# Patient Record
Sex: Female | Born: 1970 | Race: White | Hispanic: No | Marital: Married | Smoking: Never smoker
Health system: Southern US, Community
[De-identification: ages and names within clinical notes are randomized; demographics above are authoritative.]

## PROBLEM LIST (undated history)

## (undated) DIAGNOSIS — F419 Anxiety disorder, unspecified: Secondary | ICD-10-CM

## (undated) DIAGNOSIS — G510 Bell's palsy: Secondary | ICD-10-CM

## (undated) DIAGNOSIS — F32A Depression, unspecified: Secondary | ICD-10-CM

## (undated) DIAGNOSIS — I10 Essential (primary) hypertension: Secondary | ICD-10-CM

## (undated) DIAGNOSIS — F329 Major depressive disorder, single episode, unspecified: Secondary | ICD-10-CM

## (undated) HISTORY — PX: APPENDECTOMY: SHX54

---

## 2018-08-17 ENCOUNTER — Emergency Department (HOSPITAL_COMMUNITY)
Admission: EM | Admit: 2018-08-17 | Discharge: 2018-08-17 | Disposition: A | Discharge status: Home / Self Care | Payer: Self-pay | Attending: Emergency Medicine | Admitting: Emergency Medicine

## 2018-08-17 ENCOUNTER — Encounter (HOSPITAL_COMMUNITY): Payer: Self-pay

## 2018-08-17 DIAGNOSIS — R519 Headache, unspecified: Secondary | ICD-10-CM

## 2018-08-17 DIAGNOSIS — R51 Headache: Secondary | ICD-10-CM | POA: Insufficient documentation

## 2018-08-17 DIAGNOSIS — I10 Essential (primary) hypertension: Secondary | ICD-10-CM | POA: Insufficient documentation

## 2018-08-17 HISTORY — DX: Bell's palsy: G51.0

## 2018-08-17 LAB — I-STAT BETA HCG BLOOD, ED (MC, WL, AP ONLY): I-stat hCG, quantitative: <5 m[IU]/mL (ref <5)

## 2018-08-17 MED ORDER — METOCLOPRAMIDE HCL 5 MG/ML IJ SOLN
10.0000 mg | Freq: Once | INTRAMUSCULAR | Status: AC
  Administered 2018-08-17: 10 mg via INTRAVENOUS
  Filled 2018-08-17: qty 2

## 2018-08-17 MED ORDER — DIPHENHYDRAMINE HCL 50 MG/ML IJ SOLN
12.5000 mg | Freq: Once | INTRAMUSCULAR | Status: AC
  Administered 2018-08-17: 12.5 mg via INTRAVENOUS
  Filled 2018-08-17: qty 1

## 2018-08-17 MED ORDER — LISINOPRIL-HYDROCHLOROTHIAZIDE 20-12.5 MG PO TABS: 1.0000 | ORAL_TABLET | Freq: Every day | ORAL | 0 refills | Status: AC

## 2018-08-17 MED ORDER — LISINOPRIL 20 MG PO TABS
20.0000 mg | ORAL_TABLET | Freq: Once | ORAL | Status: AC
  Administered 2018-08-17: 20 mg via ORAL
  Filled 2018-08-17: qty 1

## 2018-08-17 MED ORDER — HYDROCHLOROTHIAZIDE 12.5 MG PO CAPS
12.5000 mg | ORAL_CAPSULE | Freq: Every day | ORAL | Status: DC
  Administered 2018-08-17: 12.5 mg via ORAL
  Filled 2018-08-17: qty 1

## 2018-08-17 NOTE — ED Provider Notes (Signed)
[facility] Provider Note   CSN: [id number] Arrival date & time: 08/17/18  1159   History   Chief Complaint Chief Complaint  Patient presents with  . Headache  . Hypertension    HPI Valerie Watson is a 48 y.o. female with a PMH of Bell's palsy presenting with HTN and a constant diffuse headache onset 2 days ago. Patient states she has tried tylenol without relief. Patient describes headache as throbbing, sharp pain. Patient states she ran out of her blood pressure medications 2 days ago and has not set up a new primary care provider since she moved to this area a few months ago. Patient states she takes lisinopril and HCTZ daily. Patient states loud noises make headache worse, and nothing makes headache better. Patient reports associated blurry vision and dizziness. Patient reports chronic facial asymmetry, but denies any recent changes. Patient denies nausea, vomiting, or abdominal pain. Pt denies chest pain, shortness of breath, weakness, numbness, or paresthesias. Patient denies fever, neck pain, or photophobia.   HPI  Past Medical History:  Diagnosis Date  . Bell's palsy     There are no active problems to display for this patient.   History reviewed. No pertinent surgical history.   OB History   No obstetric history on file.      Home Medications    Prior to Admission medications   Medication Sig Start Date End Date Taking? Authorizing Provider  lisinopril-hydrochlorothiazide (ZESTORETIC) 20-12.5 MG tablet Take 1 tablet by mouth daily for 20 days. 08/17/18 09/06/18  Leretha DykesHernandez, Kaitlynne Wenz P, PA-C    Family History No family history on file.  Social History Social History   Tobacco Use  . Smoking status: Not on file  Substance Use Topics  . Alcohol use: Not on file  . Drug use: Not on file     Allergies   Eggs or egg-derived products and Penicillins   Review of Systems Review of Systems  Constitutional: Negative for  activity change, appetite change, chills, diaphoresis, fatigue, fever and unexpected weight change.  HENT: Negative for congestion, ear pain, sinus pressure and sore throat.   Eyes: Positive for visual disturbance. Negative for photophobia.  Respiratory: Negative for shortness of breath.   Cardiovascular: Negative for chest pain.  Gastrointestinal: Negative for abdominal pain, nausea and vomiting.  Musculoskeletal: Negative for gait problem, myalgias, neck pain and neck stiffness.  Skin: Negative for color change and rash.  Allergic/Immunologic: Negative for immunocompromised state.  Neurological: Positive for dizziness, facial asymmetry (Pt reports this is chronic for the last 20 years.) and headaches. Negative for seizures, syncope, speech difficulty, weakness and numbness.  Hematological: Does not bruise/bleed easily.  Psychiatric/Behavioral: Negative for sleep disturbance. The patient is not nervous/anxious.     Physical Exam Updated Vital Signs BP (!) 156/99 (BP Location: Right Arm)   Pulse 81   Temp 98.2 F (36.8 C) (Oral)   Resp 16   SpO2 100%   Physical Exam Vitals signs and nursing note reviewed.  Constitutional:      General: She is not in acute distress.    Appearance: She is well-developed. She is not diaphoretic.  HENT:     Head: Normocephalic and atraumatic.  Eyes:     General: No scleral icterus.    Extraocular Movements: Extraocular movements intact.     Pupils: Pupils are equal, round, and reactive to light. Pupils are equal.  Cardiovascular:     Rate and Rhythm: Normal rate and regular rhythm.  Heart sounds: Normal heart sounds. No murmur. No friction rub. No gallop.   Pulmonary:     Effort: Pulmonary effort is normal. No respiratory distress.     Breath sounds: Normal breath sounds. No wheezing or rales.  Musculoskeletal: Normal range of motion.  Skin:    Findings: No erythema or rash.  Neurological:     Mental Status: She is alert and oriented to  person, place, and time.   Mental Status:  Alert, oriented, thought content appropriate, able to give a coherent history. Speech fluent without evidence of aphasia. Able to follow 2 step commands without difficulty.  Cranial Nerves:  II:  Peripheral visual fields grossly normal, pupils equal, round, reactive to light III,IV, VI: ptosis not present, extra-ocular motions intact bilaterally  V,VII: smile symmetric, facial light touch sensation equal VIII: hearing grossly normal to voice  X: uvula elevates symmetrically  XI: bilateral shoulder shrug symmetric and strong XII: midline tongue extension without fassiculations Motor:  Normal tone. 5/5 in upper and lower extremities bilaterally including strong and equal grip strength and dorsiflexion/plantar flexion Sensory: light touch normal in all extremities.  Deep Tendon Reflexes: 2+ and symmetric in the biceps and patella Cerebellar: normal finger-to-nose with bilateral upper extremities Gait: normal gait and balance.  Negative pronator drift. Negative Romberg sign. CV: distal pulses palpable throughout   ED Treatments / Results  Labs (all labs ordered are listed, but only abnormal results are displayed) Labs Reviewed  I-STAT BETA HCG BLOOD, ED ([facility], [facility], [facility] ONLY)    EKG None  Radiology No results found.  Procedures Procedures (including critical care time)  Medications Ordered in ED Medications  hydrochlorothiazide (MICROZIDE) capsule 12.5 mg (12.5 mg Oral Given 08/17/18 1531)  metoCLOPramide (REGLAN) injection 10 mg (10 mg Intravenous Given 08/17/18 1549)  diphenhydrAMINE (BENADRYL) injection 12.5 mg (12.5 mg Intravenous Given 08/17/18 1549)  lisinopril (PRINIVIL,ZESTRIL) tablet 20 mg (20 mg Oral Given 08/17/18 1623)     Initial Impression / Assessment and Plan / ED Course  I have reviewed the triage vital signs and the nursing notes.  Pertinent labs & imaging results that were available during my care of the patient were  reviewed by me and considered in my medical decision making (see chart for details).  Clinical Course as of Aug 17 1644  Thu Aug 17, 2018  1640 Pt reports headache has significantly improved while in the ER. Patient is requesting to be discharged.   [AH]    Clinical Course User Index [AH] Carlyle Basques P, PA-C   Pt HA treated and improved while in ED.  Patient was provided her usual blood pressure medication. Presentation is non concerning for [facility], ICH, Meningitis, or temporal arteritis. Pt is afebrile with no new focal neuro deficits or nuchal rigidity. Blood pressure has improved while in the ER. Pt is to follow up with PCP to manage HTN. Refilled BP medications, but strictly advised patient to follow up with PCP regarding BP control. Discussed returning to ER for new or worsening symptoms. Pt verbalizes understanding and is agreeable with plan to dc.   Final Clinical Impressions(s) / ED Diagnoses   Final diagnoses:  Essential hypertension  Bad headache    ED Discharge Orders         Ordered    lisinopril-hydrochlorothiazide (ZESTORETIC) 20-12.5 MG tablet  Daily     08/17/18 1646           Leretha Dykes, [place] 08/17/18 1647    Little, Ambrose Finland, MD 08/18/18 [phone]

## 2018-08-17 NOTE — ED Triage Notes (Signed)
Pt presents for evaluation of headache x 2-3 days. States normally takes lisinopril and HCTZ for BP and ran out of medications.

## 2018-08-17 NOTE — Discharge Instructions (Signed)
You have been seen today for headache and high blood pressure. Please read and follow all provided instructions.   1. Medications: Lisinopril, HCTZ, usual home medications 2. Treatment: rest, drink plenty of fluids 3. Follow Up: Please follow up with your primary doctor in 2 days for discussion of your diagnoses and further evaluation after today's visit; if you do not have a primary care doctor use the resource guide provided to find one; Please return to the ER for any new or worsening symptoms. Please obtain all of your results from medical records or have your doctors office obtain the results - share them with your doctor - you should be seen at your doctors office. Call today to arrange your follow up.   Take medications as prescribed. Please review all of the medicines and only take them if you do not have an allergy to them. Return to the emergency room for worsening condition or new concerning symptoms. Follow up with your regular doctor. If you don't have a regular doctor use one of the numbers below to establish a primary care doctor.  Please be aware that if you are taking birth control pills, taking other prescriptions, ESPECIALLY ANTIBIOTICS may make the birth control ineffective - if this is the case, either do not engage in sexual activity or use alternative methods of birth control such as condoms until you have finished the medicine and your family doctor says it is OK to restart them. If you are on a blood thinner such as COUMADIN, be aware that any other medicine that you take may cause the coumadin to either work too much, or not enough - you should have your coumadin level rechecked in next 7 days if this is the case.  ?  It is also a possibility that you have an allergic reaction to any of the medicines that you have been prescribed - Everybody reacts differently to medications and while MOST people have no trouble with most medicines, you may have a reaction such as nausea,  vomiting, rash, swelling, shortness of breath. If this is the case, please stop taking the medicine immediately and contact your physician.  ?  You should return to the ER if you develop severe or worsening symptoms.   Emergency Department Resource Guide 1) Find a Doctor and Pay Out of Pocket Although you won't have to find out who is covered by your insurance plan, it is a good idea to ask around and get recommendations. You will then need to call the office and see if the doctor you have chosen will accept you as a new patient and what types of options they offer for patients who are self-pay. Some doctors offer discounts or will set up payment plans for their patients who do not have insurance, but you will need to ask so you aren't surprised when you get to your appointment.  2) Contact Your Local Health Department Not all health departments have doctors that can see patients for sick visits, but many do, so it is worth a call to see if yours does. If you don't know where your local health department is, you can check in your phone book. The CDC also has a tool to help you locate your state's health department, and many state websites also have listings of all of their local health departments.  3) Find a Walk-in Clinic If your illness is not likely to be very severe or complicated, you may want to try a walk in clinic. These  are popping up all over the country in pharmacies, drugstores, and shopping centers. They're usually staffed by nurse practitioners or physician assistants that have been trained to treat common illnesses and complaints. They're usually fairly quick and inexpensive. However, if you have serious medical issues or chronic medical problems, these are probably not your best option.  No Primary Care Doctor: Call Health Connect at  [phone] - they can help you locate a primary care doctor that  accepts your insurance, provides certain services, etc. Physician Referral [phone]  Emergency Department Resource Guide 1) Find a Doctor and Pay Out of Pocket Although you won't have to find out who is covered by your insurance plan, it is a good idea to ask around and get recommendations. You will then need to call the office and see if the doctor you have chosen will accept you as a new patient and what types of options they offer for patients who are self-pay. Some doctors offer discounts or will set up payment plans for their patients who do not have insurance, but you will need to ask so you aren't surprised when you get to your appointment.  2) Contact Your Local Health Department Not all health departments have doctors that can see patients for sick visits, but many do, so it is worth a call to see if yours does. If you don't know where your local health department is, you can check in your phone book. The CDC also has a tool to help you locate your state's health department, and many state websites also have listings of all of their local health departments.  3) Find a [facility] If your illness is not likely to be very severe or complicated, you may want to try a walk in clinic. These are popping up all over the country in pharmacies, drugstores, and shopping centers. They're usually staffed by nurse practitioners or physician assistants that have been trained to treat common illnesses and complaints. They're usually fairly quick and inexpensive. However, if you have serious medical issues or chronic medical problems, these are probably not your best option.  No Primary Care Doctor: Call Health Connect at  [phone] - they can help you locate a primary care doctor that  accepts your insurance, provides certain services, etc. Physician Referral Service- [phone]  Chronic Pain Problems: Organization         Address  Phone   Notes  [facility]  [phone] Patients need to be referred by their primary care doctor.    Medication Assistance: Organization         Address  Phone   Notes  [facility] Medication [facility] [address] [place], [place] [address] [phone] --Must be a resident of [facility] -- Must have NO insurance coverage whatsoever (no Medicaid/ Medicare, etc.) -- The pt. MUST have a primary care doctor that directs their care regularly and follows them in the community   [facility]  [phone]   [employer]  [phone]    Agencies that provide inexpensive medical care: Organization         Address  Phone   Notes  [place]  [phone]   [facility]    [phone]   [facility] [place] [place],  [phone]   [place] [address] [place] [phone]   [facility]anned Parenthood    (  [phone]   [facility]    [phone]   [facility]  [address], [place] Phone:  [phone], Fax:  [phone] Hours of Operation:  9 am - 6 pm, M-F.  Also accepts Medicaid/Medicare and self-pay.  [facility] for [place] [address], [address], [place] Phone: [phone], Fax: [phone]. Hours of Operation:  8:30 am - 5:30 pm, M-F.  Also accepts Medicaid and self-pay.  [facility] [place] [address], [place] Phone: [phone]   [place], [place], [place] [phone], Ext. [phone] Mondays & Thursdays: 7-9 AM.  First 15 patients are seen on a first come, first serve basis.    [place] Providers:  Organization         Address  Phone   Notes  [facility] [address], [place] [phone] Also accepts self-pay patients.  [facility] [address], [place]  [phone]   [place], [address], [place] [phone]   [facility] [address] [place] [phone]   Lucianne Lei [address], [place]   [phone] Only accepts [place] [facility] [place] patients after they have their name applied to their card.   Self-Pay (no insurance) in [facility]:  Organization         Address  Phone   Notes  Sickle Cell Patients, [facility] [place] [phone]   [facility] [place] [phone]   [facility] [place]  [place], [place], [place] [phone]   [facility]/Dr. Osei-Bonsu  [address], [place] or [address], [place] [phone] Phone number for both [place] and [place] locations is the same.  [facility] and [facility] [address], [place] [phone]   [facility] [address] [place] or [address] [phone]   [facility] [address] [place] [phone], phone; [phone], fax Sees patients 1st and 3rd Saturday of every month.  Must not qualify for public or private insurance (i.e. Medicaid, Medicare, [place] Health Choice, Veterans' Benefits)  Household income should be no more than 200% of the poverty level The clinic cannot treat you if you are pregnant or think you are pregnant  Sexually transmitted diseases are not treated at the clinic.

## 2018-08-22 ENCOUNTER — Emergency Department: Payer: Self-pay

## 2018-08-22 ENCOUNTER — Encounter: Payer: Self-pay | Admitting: Emergency Medicine

## 2018-08-22 ENCOUNTER — Emergency Department: Payer: Self-pay | Admitting: Anesthesiology

## 2018-08-22 ENCOUNTER — Other Ambulatory Visit: Payer: Self-pay

## 2018-08-22 ENCOUNTER — Encounter
Admission: EM | Disposition: A | Discharge status: Home / Self Care | Payer: Self-pay | Source: Home / Self Care | Attending: Emergency Medicine

## 2018-08-22 ENCOUNTER — Observation Stay
Admission: EM | Admit: 2018-08-22 | Discharge: 2018-08-23 | Disposition: A | Discharge status: Home / Self Care | Payer: Self-pay | Attending: Surgery | Admitting: Surgery

## 2018-08-22 DIAGNOSIS — S82301A Unspecified fracture of lower end of right tibia, initial encounter for closed fracture: Secondary | ICD-10-CM

## 2018-08-22 DIAGNOSIS — I10 Essential (primary) hypertension: Secondary | ICD-10-CM | POA: Insufficient documentation

## 2018-08-22 DIAGNOSIS — S82891A Other fracture of right lower leg, initial encounter for closed fracture: Secondary | ICD-10-CM | POA: Diagnosis present

## 2018-08-22 DIAGNOSIS — S82851A Displaced trimalleolar fracture of right lower leg, initial encounter for closed fracture: Principal | ICD-10-CM | POA: Insufficient documentation

## 2018-08-22 DIAGNOSIS — IMO0001 Reserved for inherently not codable concepts without codable children: Secondary | ICD-10-CM

## 2018-08-22 DIAGNOSIS — Z419 Encounter for procedure for purposes other than remedying health state, unspecified: Secondary | ICD-10-CM

## 2018-08-22 DIAGNOSIS — F419 Anxiety disorder, unspecified: Secondary | ICD-10-CM | POA: Insufficient documentation

## 2018-08-22 DIAGNOSIS — S82831A Other fracture of upper and lower end of right fibula, initial encounter for closed fracture: Secondary | ICD-10-CM

## 2018-08-22 DIAGNOSIS — F329 Major depressive disorder, single episode, unspecified: Secondary | ICD-10-CM | POA: Insufficient documentation

## 2018-08-22 DIAGNOSIS — J449 Chronic obstructive pulmonary disease, unspecified: Secondary | ICD-10-CM | POA: Insufficient documentation

## 2018-08-22 DIAGNOSIS — Z23 Encounter for immunization: Secondary | ICD-10-CM | POA: Insufficient documentation

## 2018-08-22 DIAGNOSIS — Z9889 Other specified postprocedural states: Secondary | ICD-10-CM

## 2018-08-22 DIAGNOSIS — S9304XA Dislocation of right ankle joint, initial encounter: Secondary | ICD-10-CM

## 2018-08-22 DIAGNOSIS — W010XXA Fall on same level from slipping, tripping and stumbling without subsequent striking against object, initial encounter: Secondary | ICD-10-CM | POA: Insufficient documentation

## 2018-08-22 HISTORY — DX: Depression, unspecified: F32.A

## 2018-08-22 HISTORY — PX: ORIF ANKLE FRACTURE: SHX5408

## 2018-08-22 HISTORY — DX: Essential (primary) hypertension: I10

## 2018-08-22 HISTORY — DX: Major depressive disorder, single episode, unspecified: F32.9

## 2018-08-22 HISTORY — DX: Anxiety disorder, unspecified: F41.9

## 2018-08-22 LAB — CBC WITH DIFFERENTIAL/PLATELET
Abs Immature Granulocytes: 0.03 K/uL (ref 0.00–0.07)
Basophils Absolute: 0 K/uL (ref 0.0–0.1)
Basophils Relative: 1 %
Eosinophils Absolute: 0.1 K/uL (ref 0.0–0.5)
Eosinophils Relative: 1 %
HCT: 40.1 % (ref 36.0–46.0)
Hemoglobin: 13.3 g/dL (ref 12.0–15.0)
Immature Granulocytes: 0 %
Lymphocytes Relative: 16 %
Lymphs Abs: 1.1 K/uL (ref 0.7–4.0)
MCH: 31.9 pg (ref 26.0–34.0)
MCHC: 33.2 g/dL (ref 30.0–36.0)
MCV: 96.2 fL (ref 80.0–100.0)
Monocytes Absolute: 0.5 K/uL (ref 0.1–1.0)
Monocytes Relative: 7 %
Neutro Abs: 5.2 K/uL (ref 1.7–7.7)
Neutrophils Relative %: 75 %
Platelets: 304 K/uL (ref 150–400)
RBC: 4.17 MIL/uL (ref 3.87–5.11)
RDW: 12.9 % (ref 11.5–15.5)
WBC: 7 K/uL (ref 4.0–10.5)
nRBC: 0 % (ref 0.0–0.2)

## 2018-08-22 LAB — COMPREHENSIVE METABOLIC PANEL WITH GFR
ALT: 37 U/L (ref 0–44)
AST: 30 U/L (ref 15–41)
Albumin: 4.7 g/dL (ref 3.5–5.0)
Alkaline Phosphatase: 73 U/L (ref 38–126)
Anion gap: 10 (ref 5–15)
BUN: 22 mg/dL — ABNORMAL HIGH (ref 6–20)
CO2: 24 mmol/L (ref 22–32)
Calcium: 9.1 mg/dL (ref 8.9–10.3)
Chloride: 107 mmol/L (ref 98–111)
Creatinine, Ser: 0.78 mg/dL (ref 0.44–1.00)
GFR calc Af Amer: >60 mL/min (ref >60)
GFR calc non Af Amer: >60 mL/min (ref >60)
Glucose, Bld: 118 mg/dL — ABNORMAL HIGH (ref 70–99)
Potassium: 3.3 mmol/L — ABNORMAL LOW (ref 3.5–5.1)
Sodium: 141 mmol/L (ref 135–145)
Total Bilirubin: 0.6 mg/dL (ref 0.3–1.2)
Total Protein: 8 g/dL (ref 6.5–8.1)

## 2018-08-22 SURGERY — OPEN REDUCTION INTERNAL FIXATION (ORIF) ANKLE FRACTURE
Anesthesia: General | Site: Ankle | Laterality: Right

## 2018-08-22 MED ORDER — ACETAMINOPHEN 325 MG PO TABS: 325.0000 mg | ORAL_TABLET | Freq: Four times a day (QID) | ORAL | Status: DC | PRN

## 2018-08-22 MED ORDER — CLINDAMYCIN PHOSPHATE 600 MG/50ML IV SOLN
600.0000 mg | Freq: Four times a day (QID) | INTRAVENOUS | Status: AC
  Administered 2018-08-23 (×3): 600 mg via INTRAVENOUS
  Filled 2018-08-22 (×3): qty 50

## 2018-08-22 MED ORDER — KETOROLAC TROMETHAMINE 30 MG/ML IJ SOLN
INTRAMUSCULAR | Status: AC
  Filled 2018-08-22: qty 1

## 2018-08-22 MED ORDER — LIDOCAINE HCL (PF) 2 % IJ SOLN
INTRAMUSCULAR | Status: AC
  Filled 2018-08-22: qty 10

## 2018-08-22 MED ORDER — LORAZEPAM 2 MG/ML IJ SOLN
INTRAMUSCULAR | Status: AC
  Filled 2018-08-22: qty 1

## 2018-08-22 MED ORDER — FENTANYL CITRATE (PF) 100 MCG/2ML IJ SOLN
INTRAMUSCULAR | Status: DC | PRN
  Administered 2018-08-22 (×4): 50 ug via INTRAVENOUS

## 2018-08-22 MED ORDER — HYDROMORPHONE HCL 1 MG/ML IJ SOLN
0.5000 mg | Freq: Once | INTRAMUSCULAR | Status: DC
  Filled 2018-08-22: qty 1

## 2018-08-22 MED ORDER — HYDROMORPHONE HCL 1 MG/ML IJ SOLN
0.2500 mg | INTRAMUSCULAR | Status: DC | PRN
  Administered 2018-08-22 (×4): 0.25 mg via INTRAVENOUS

## 2018-08-22 MED ORDER — LIDOCAINE HCL (CARDIAC) PF 100 MG/5ML IV SOSY
PREFILLED_SYRINGE | INTRAVENOUS | Status: DC | PRN
  Administered 2018-08-22: 80 mg via INTRAVENOUS

## 2018-08-22 MED ORDER — LORAZEPAM 1 MG PO TABS: 1.5000 mg | ORAL_TABLET | Freq: Every day | ORAL | Status: DC

## 2018-08-22 MED ORDER — KETAMINE HCL 10 MG/ML IJ SOLN: 60.0000 mg | Freq: Once | INTRAMUSCULAR | Status: DC

## 2018-08-22 MED ORDER — HYDROMORPHONE HCL 1 MG/ML IJ SOLN
INTRAMUSCULAR | Status: AC
  Administered 2018-08-22: 0.25 mg via INTRAVENOUS
  Filled 2018-08-22: qty 0.5

## 2018-08-22 MED ORDER — INFLUENZA VAC SPLIT QUAD 0.5 ML IM SUSY
0.5000 mL | PREFILLED_SYRINGE | INTRAMUSCULAR | Status: AC
  Administered 2018-08-23: 0.5 mL via INTRAMUSCULAR
  Filled 2018-08-22: qty 0.5

## 2018-08-22 MED ORDER — KETOROLAC TROMETHAMINE 30 MG/ML IJ SOLN
30.0000 mg | Freq: Once | INTRAMUSCULAR | Status: AC
  Administered 2018-08-22: 30 mg via INTRAVENOUS

## 2018-08-22 MED ORDER — KETAMINE HCL 10 MG/ML IJ SOLN
INTRAMUSCULAR | Status: AC | PRN
  Administered 2018-08-22: 60 mg via INTRAVENOUS
  Administered 2018-08-22: 20 mg via INTRAVENOUS

## 2018-08-22 MED ORDER — MIRTAZAPINE 15 MG PO TABS: 15.0000 mg | ORAL_TABLET | Freq: Every day | ORAL | Status: DC

## 2018-08-22 MED ORDER — ACETAMINOPHEN 500 MG PO TABS
1000.0000 mg | ORAL_TABLET | Freq: Four times a day (QID) | ORAL | Status: DC
  Administered 2018-08-23: 1000 mg via ORAL
  Filled 2018-08-22 (×2): qty 2

## 2018-08-22 MED ORDER — LACTATED RINGERS IV SOLN
INTRAVENOUS | Status: DC | PRN
  Administered 2018-08-22 (×2): via INTRAVENOUS

## 2018-08-22 MED ORDER — FAMOTIDINE 20 MG PO TABS
ORAL_TABLET | ORAL | Status: AC
  Administered 2018-08-22: 20 mg via ORAL
  Filled 2018-08-22: qty 1

## 2018-08-22 MED ORDER — KETAMINE HCL 10 MG/ML IJ SOLN
INTRAMUSCULAR | Status: AC
  Filled 2018-08-22: qty 1

## 2018-08-22 MED ORDER — DOCUSATE SODIUM 100 MG PO CAPS
100.0000 mg | ORAL_CAPSULE | Freq: Two times a day (BID) | ORAL | Status: DC
  Administered 2018-08-22 – 2018-08-23 (×2): 100 mg via ORAL
  Filled 2018-08-22 (×2): qty 1

## 2018-08-22 MED ORDER — MIDAZOLAM HCL 2 MG/2ML IJ SOLN
INTRAMUSCULAR | Status: DC | PRN
  Administered 2018-08-22: 2 mg via INTRAVENOUS

## 2018-08-22 MED ORDER — ONDANSETRON HCL 4 MG/2ML IJ SOLN
INTRAMUSCULAR | Status: AC
  Filled 2018-08-22: qty 2

## 2018-08-22 MED ORDER — SODIUM CHLORIDE 0.9 % IV SOLN
INTRAVENOUS | Status: DC
  Administered 2018-08-22: 23:00:00 via INTRAVENOUS

## 2018-08-22 MED ORDER — MIDAZOLAM HCL 2 MG/2ML IJ SOLN
INTRAMUSCULAR | Status: AC
  Filled 2018-08-22: qty 2

## 2018-08-22 MED ORDER — DIPHENHYDRAMINE HCL 12.5 MG/5ML PO ELIX: 12.5000 mg | ORAL_SOLUTION | ORAL | Status: DC | PRN

## 2018-08-22 MED ORDER — FENTANYL CITRATE (PF) 100 MCG/2ML IJ SOLN
INTRAMUSCULAR | Status: AC | PRN
  Administered 2018-08-22: 100 ug via INTRAVENOUS

## 2018-08-22 MED ORDER — FAMOTIDINE 20 MG PO TABS
20.0000 mg | ORAL_TABLET | Freq: Once | ORAL | Status: AC
  Administered 2018-08-22: 20 mg via ORAL

## 2018-08-22 MED ORDER — FLEET ENEMA 7-19 GM/118ML RE ENEM: 1.0000 | ENEMA | Freq: Once | RECTAL | Status: DC | PRN

## 2018-08-22 MED ORDER — LISINOPRIL 20 MG PO TABS
20.0000 mg | ORAL_TABLET | Freq: Every day | ORAL | Status: DC
  Filled 2018-08-22: qty 1

## 2018-08-22 MED ORDER — CLINDAMYCIN PHOSPHATE 600 MG/50ML IV SOLN
600.0000 mg | Freq: Once | INTRAVENOUS | Status: AC
  Administered 2018-08-22: 600 mg via INTRAVENOUS

## 2018-08-22 MED ORDER — ENOXAPARIN SODIUM 40 MG/0.4ML ~~LOC~~ SOLN
40.0000 mg | SUBCUTANEOUS | Status: DC
  Administered 2018-08-23: 40 mg via SUBCUTANEOUS
  Filled 2018-08-22: qty 0.4

## 2018-08-22 MED ORDER — KETOROLAC TROMETHAMINE 15 MG/ML IJ SOLN
15.0000 mg | Freq: Four times a day (QID) | INTRAMUSCULAR | Status: DC
  Administered 2018-08-23: 15 mg via INTRAVENOUS
  Filled 2018-08-22 (×2): qty 1

## 2018-08-22 MED ORDER — METOCLOPRAMIDE HCL 10 MG PO TABS: 5.0000 mg | ORAL_TABLET | Freq: Three times a day (TID) | ORAL | Status: DC | PRN

## 2018-08-22 MED ORDER — ACETAMINOPHEN 10 MG/ML IV SOLN
INTRAVENOUS | Status: AC
  Filled 2018-08-22: qty 100

## 2018-08-22 MED ORDER — BUPIVACAINE HCL 0.5 % IJ SOLN
INTRAMUSCULAR | Status: DC | PRN
  Administered 2018-08-22: 30 mL

## 2018-08-22 MED ORDER — FENTANYL CITRATE (PF) 100 MCG/2ML IJ SOLN
INTRAMUSCULAR | Status: AC
  Filled 2018-08-22: qty 2

## 2018-08-22 MED ORDER — LISINOPRIL-HYDROCHLOROTHIAZIDE 20-12.5 MG PO TABS: 1.0000 | ORAL_TABLET | Freq: Every day | ORAL | Status: DC

## 2018-08-22 MED ORDER — HYDROMORPHONE HCL 1 MG/ML IJ SOLN
0.2500 mg | INTRAMUSCULAR | Status: AC | PRN
  Administered 2018-08-22 (×2): 0.25 mg via INTRAVENOUS

## 2018-08-22 MED ORDER — METOCLOPRAMIDE HCL 5 MG/ML IJ SOLN: 5.0000 mg | Freq: Three times a day (TID) | INTRAMUSCULAR | Status: DC | PRN

## 2018-08-22 MED ORDER — OXYCODONE HCL 5 MG PO TABS
5.0000 mg | ORAL_TABLET | ORAL | Status: DC | PRN
  Administered 2018-08-22: 10 mg via ORAL
  Filled 2018-08-22: qty 2

## 2018-08-22 MED ORDER — PHENYLEPHRINE HCL 10 MG/ML IJ SOLN
INTRAMUSCULAR | Status: DC | PRN
  Administered 2018-08-22 (×2): 100 ug via INTRAVENOUS

## 2018-08-22 MED ORDER — SODIUM CHLORIDE 0.9 % IV SOLN
INTRAVENOUS | Status: DC | PRN
  Administered 2018-08-22: 500 mL

## 2018-08-22 MED ORDER — ACETAMINOPHEN 10 MG/ML IV SOLN
INTRAVENOUS | Status: DC | PRN
  Administered 2018-08-22: 1000 mg via INTRAVENOUS

## 2018-08-22 MED ORDER — SUGAMMADEX SODIUM 200 MG/2ML IV SOLN
INTRAVENOUS | Status: DC | PRN
  Administered 2018-08-22: 130 mg via INTRAVENOUS

## 2018-08-22 MED ORDER — ROCURONIUM BROMIDE 100 MG/10ML IV SOLN
INTRAVENOUS | Status: DC | PRN
  Administered 2018-08-22: 20 mg via INTRAVENOUS

## 2018-08-22 MED ORDER — LORAZEPAM 2 MG/ML IJ SOLN
INTRAMUSCULAR | Status: AC | PRN
  Administered 2018-08-22: 0.5 mg via INTRAVENOUS

## 2018-08-22 MED ORDER — HYDROMORPHONE HCL 1 MG/ML IJ SOLN
0.5000 mg | Freq: Once | INTRAMUSCULAR | Status: AC
  Administered 2018-08-22: 0.5 mg via INTRAVENOUS
  Filled 2018-08-22: qty 1

## 2018-08-22 MED ORDER — ONDANSETRON HCL 4 MG/2ML IJ SOLN: 4.0000 mg | Freq: Four times a day (QID) | INTRAMUSCULAR | Status: DC | PRN

## 2018-08-22 MED ORDER — BISACODYL 10 MG RE SUPP: 10.0000 mg | Freq: Every day | RECTAL | Status: DC | PRN

## 2018-08-22 MED ORDER — CLINDAMYCIN PHOSPHATE 600 MG/50ML IV SOLN
INTRAVENOUS | Status: AC
  Filled 2018-08-22: qty 50

## 2018-08-22 MED ORDER — EPHEDRINE SULFATE 50 MG/ML IJ SOLN
INTRAMUSCULAR | Status: DC | PRN
  Administered 2018-08-22: 10 mg via INTRAVENOUS
  Administered 2018-08-22: 5 mg via INTRAVENOUS

## 2018-08-22 MED ORDER — ONDANSETRON HCL 4 MG/2ML IJ SOLN
4.0000 mg | Freq: Once | INTRAMUSCULAR | Status: AC
  Administered 2018-08-22: 4 mg via INTRAVENOUS
  Filled 2018-08-22: qty 2

## 2018-08-22 MED ORDER — LACTATED RINGERS IV SOLN
INTRAVENOUS | Status: DC
  Administered 2018-08-22: 17:00:00 via INTRAVENOUS

## 2018-08-22 MED ORDER — TRAMADOL HCL 50 MG PO TABS
50.0000 mg | ORAL_TABLET | Freq: Four times a day (QID) | ORAL | Status: DC | PRN
  Administered 2018-08-23: 50 mg via ORAL
  Filled 2018-08-22: qty 1

## 2018-08-22 MED ORDER — MAGNESIUM HYDROXIDE 400 MG/5ML PO SUSP: 30.0000 mL | Freq: Every day | ORAL | Status: DC | PRN

## 2018-08-22 MED ORDER — HYDROCHLOROTHIAZIDE 12.5 MG PO CAPS
12.5000 mg | ORAL_CAPSULE | Freq: Every day | ORAL | Status: DC
  Filled 2018-08-22: qty 1

## 2018-08-22 MED ORDER — SUCCINYLCHOLINE CHLORIDE 20 MG/ML IJ SOLN
INTRAMUSCULAR | Status: DC | PRN
  Administered 2018-08-22: 100 mg via INTRAVENOUS

## 2018-08-22 MED ORDER — HYDROMORPHONE HCL 1 MG/ML IJ SOLN
INTRAMUSCULAR | Status: AC
  Administered 2018-08-22: 0.25 mg via INTRAVENOUS
  Filled 2018-08-22: qty 1

## 2018-08-22 MED ORDER — HYDROMORPHONE HCL 1 MG/ML IJ SOLN
0.2500 mg | INTRAMUSCULAR | Status: DC | PRN
  Administered 2018-08-23: 0.5 mg via INTRAVENOUS
  Filled 2018-08-22: qty 1

## 2018-08-22 MED ORDER — PROPOFOL 10 MG/ML IV BOLUS
INTRAVENOUS | Status: AC
  Filled 2018-08-22: qty 20

## 2018-08-22 MED ORDER — ONDANSETRON HCL 4 MG/2ML IJ SOLN
INTRAMUSCULAR | Status: DC | PRN
  Administered 2018-08-22: 4 mg via INTRAVENOUS

## 2018-08-22 MED ORDER — PROMETHAZINE HCL 25 MG/ML IJ SOLN: 6.2500 mg | INTRAMUSCULAR | Status: DC | PRN

## 2018-08-22 MED ORDER — ONDANSETRON HCL 4 MG PO TABS: 4.0000 mg | ORAL_TABLET | Freq: Four times a day (QID) | ORAL | Status: DC | PRN

## 2018-08-22 SURGICAL SUPPLY — 64 items
BANDAGE ACE 4X5 VEL STRL LF (GAUZE/BANDAGES/DRESSINGS) ×2 IMPLANT
BANDAGE ACE 6X5 VEL STRL LF (GAUZE/BANDAGES/DRESSINGS) ×1 IMPLANT
BIT DRILL 2.5X2.75 QC CALB (BIT) ×2 IMPLANT
BIT DRILL 2.9 CANN QC NONSTRL (BIT) ×2 IMPLANT
BIT DRILL 3.5X5.5 QC CALB (BIT) ×2 IMPLANT
BIT DRILL CALIBRATED 2.7 (BIT) ×1 IMPLANT
BIT DRILL CALIBRATED 2.7MM (BIT) ×1
BLADE SURG SZ10 CARB STEEL (BLADE) ×6 IMPLANT
BNDG COHESIVE 4X5 TAN STRL (GAUZE/BANDAGES/DRESSINGS) ×3 IMPLANT
BNDG ESMARK 6X12 TAN STRL LF (GAUZE/BANDAGES/DRESSINGS) ×3 IMPLANT
BNDG PLASTER FAST 4X5 WHT LF (CAST SUPPLIES) ×4 IMPLANT
CANISTER SUCT 1200ML W/VALVE (MISCELLANEOUS) ×3 IMPLANT
CHLORAPREP W/TINT 26ML (MISCELLANEOUS) ×6 IMPLANT
COVER WAND RF STERILE (DRAPES) ×1 IMPLANT
CUFF TOURN 24 STER (MISCELLANEOUS) ×2 IMPLANT
CUFF TOURN 30 STER DUAL PORT (MISCELLANEOUS) IMPLANT
DRAPE C-ARM XRAY 36X54 (DRAPES) ×3 IMPLANT
DRAPE C-ARMOR (DRAPES) ×3 IMPLANT
DRAPE INCISE IOBAN 66X45 STRL (DRAPES) ×3 IMPLANT
DRAPE U-SHAPE 47X51 STRL (DRAPES) ×3 IMPLANT
ELECT CAUTERY BLADE 6.4 (BLADE) ×3 IMPLANT
ELECT REM PT RETURN 9FT ADLT (ELECTROSURGICAL) ×3
ELECTRODE REM PT RTRN 9FT ADLT (ELECTROSURGICAL) ×1 IMPLANT
GAUZE PETRO XEROFOAM 1X8 (MISCELLANEOUS) ×1 IMPLANT
GAUZE SPONGE 4X4 12PLY STRL (GAUZE/BANDAGES/DRESSINGS) ×1 IMPLANT
GLOVE BIO SURGEON STRL SZ8 (GLOVE) ×12 IMPLANT
GLOVE INDICATOR 8.0 STRL GRN (GLOVE) ×7 IMPLANT
GOWN STRL REUS W/ TWL LRG LVL3 (GOWN DISPOSABLE) ×1 IMPLANT
GOWN STRL REUS W/ TWL XL LVL3 (GOWN DISPOSABLE) ×1 IMPLANT
GOWN STRL REUS W/TWL LRG LVL3 (GOWN DISPOSABLE) ×4
GOWN STRL REUS W/TWL XL LVL3 (GOWN DISPOSABLE) ×2
HEMOVAC 400ML (MISCELLANEOUS)
K-WIRE ACE 1.6X6 (WIRE) ×9
KIT DRAIN HEMOVAC JP 7FR 400ML (MISCELLANEOUS) ×1 IMPLANT
KIT TURNOVER KIT A (KITS) ×3 IMPLANT
KWIRE ACE 1.6X6 (WIRE) IMPLANT
LABEL OR SOLS (LABEL) ×3 IMPLANT
NS IRRIG 1000ML POUR BTL (IV SOLUTION) ×3 IMPLANT
PACK EXTREMITY ARMC (MISCELLANEOUS) ×3 IMPLANT
PAD ABD DERMACEA PRESS 5X9 (GAUZE/BANDAGES/DRESSINGS) ×2 IMPLANT
PAD CAST CTTN 4X4 STRL (SOFTGOODS) ×2 IMPLANT
PAD PREP 24X41 OB/GYN DISP (PERSONAL CARE ITEMS) ×3 IMPLANT
PADDING CAST COTTON 4X4 STRL (SOFTGOODS) ×4
PLATE LOCK 7H 92 BILAT FIB (Plate) ×2 IMPLANT
SCREW ACE CAN 4.0 38M (Screw) ×2 IMPLANT
SCREW ACE CAN 4.0 40M (Screw) ×4 IMPLANT
SCREW CORTICAL 3.5MM  20MM (Screw) ×2 IMPLANT
SCREW CORTICAL 3.5MM 20MM (Screw) IMPLANT
SCREW CORTICAL 3.5MM 22MM (Screw) ×2 IMPLANT
SCREW LOCK CORT STAR 3.5X12 (Screw) ×2 IMPLANT
SCREW LOCK CORT STAR 3.5X14 (Screw) ×2 IMPLANT
SCREW LOCK CORT STAR 3.5X16 (Screw) ×2 IMPLANT
SCREW LOW PROFILE 12MMX3.5MM (Screw) ×2 IMPLANT
SCREW NON LOCKING LP 3.5 14MM (Screw) ×2 IMPLANT
SPLINT CAST 1 STEP 4X30 (MISCELLANEOUS) ×4 IMPLANT
SPONGE LAP 18X18 RF (DISPOSABLE) ×1 IMPLANT
STAPLER SKIN PROX 35W (STAPLE) ×3 IMPLANT
STOCKINETTE IMPERV 14X48 (MISCELLANEOUS) ×3 IMPLANT
SUT VIC AB 0 CT1 36 (SUTURE) ×3 IMPLANT
SUT VIC AB 2-0 SH 27 (SUTURE) ×6
SUT VIC AB 2-0 SH 27XBRD (SUTURE) ×2 IMPLANT
SUT VIC AB 3-0 SH 27 (SUTURE) ×2
SUT VIC AB 3-0 SH 27X BRD (SUTURE) IMPLANT
SYR 10ML LL (SYRINGE) ×3 IMPLANT

## 2018-08-22 NOTE — Anesthesia Post-op Follow-up Note (Signed)
Anesthesia QCDR form completed.        

## 2018-08-22 NOTE — Sedation Documentation (Signed)
Patient very anxious coming out of ketamine . Crying and c/o of pain. Md at bedside, Ativan ordered for anxiety. Vss. Patient scheduled for OR at 1800. Report given to OR nurse.

## 2018-08-22 NOTE — Anesthesia Procedure Notes (Signed)
Date/Time: 08/22/2018 7:12 PM Performed by: Waldo Laine, CRNA

## 2018-08-22 NOTE — Anesthesia Procedure Notes (Signed)
Procedure Name: Intubation Date/Time: 08/22/2018 7:12 PM Performed by: Waldo Laine, CRNA Pre-anesthesia Checklist: Patient identified, Patient being monitored, Timeout performed, Emergency Drugs available and Suction available Patient Re-evaluated:Patient Re-evaluated prior to induction Oxygen Delivery Method: Circle system utilized Preoxygenation: Pre-oxygenation with 100% oxygen Induction Type: IV induction Ventilation: Mask ventilation without difficulty Laryngoscope Size: Miller and 2 Grade View: Grade II Tube type: Oral Tube size: 7.0 mm Number of attempts: 1 Airway Equipment and Method: Stylet Placement Confirmation: ETT inserted through vocal cords under direct vision,  positive ETCO2 and breath sounds checked- equal and bilateral Secured at: 21 cm Tube secured with: Tape Dental Injury: Teeth and Oropharynx as per pre-operative assessment  Difficulty Due To: Difficult Airway- due to limited oral opening

## 2018-08-22 NOTE — ED Provider Notes (Signed)
.b ----------------------------------------- 2:02 PM on 08/22/2018 -----------------------------------------  Brought over to the major side at this time, history of non-syncopal fall have examined the patient, no closed head injury, no p.o. since 8 this morning, has a dislocated fracture of the distal right ankle.  Pulses intact.  Orthopedic surgery has been consulted they are down here to reduce it.  I am to do moderate sedation.  I did explain to the patient all of the risk benefits and alternatives to moderate sedation.  She voices understanding and agreement with the plan.  She understands the alternative of not doing moderate sedation which she would prefer to avoid.  Patient has never had a problem with anesthesia, we will provide sedation while orthopedic surgery reduces the fracture  .Sedation Date/Time: 08/22/2018 2:05 PM Performed by: Jeanmarie Plant, MD Authorized by: Jeanmarie Plant, MD   Consent:    Consent obtained:  Written (electronic informed consent)   Risks discussed:  Allergic reaction, dysrhythmia, inadequate sedation, nausea, vomiting, respiratory compromise necessitating ventilatory assistance and intubation, prolonged sedation necessitating reversal and prolonged hypoxia resulting in organ damage Universal protocol:    Procedure explained and questions answered to patient or proxy's satisfaction: yes     Relevant documents present and verified: yes     Test results available and properly labeled: yes     Imaging studies available: yes     Required blood products, implants, devices, and special equipment available: yes     Immediately prior to procedure a time out was called: yes     Patient identity confirmation method:  Arm band Indications:    Procedure performed:  Fracture reduction   Procedure necessitating sedation performed by:  Different physician Pre-sedation assessment:    Time since last food or drink:  8   ASA classification: class 1 - normal, healthy  patient     Neck mobility: normal     Thyromental distance:  3 finger widths   Mallampati score:  II - soft palate, uvula, fauces visible   Pre-sedation assessments completed and reviewed: airway patency     Pre-sedation assessment completed:  08/22/2018 2:06 PM Immediate pre-procedure details:    Reassessment: Patient reassessed immediately prior to procedure     Reviewed: vital signs, relevant labs/tests and NPO status     Verified: bag valve mask available, emergency equipment available, intubation equipment available, IV patency confirmed, oxygen available, reversal medications available and suction available   Procedure details (see MAR for exact dosages):    Intra-procedure monitoring:  Blood pressure monitoring, continuous pulse oximetry, cardiac monitor, frequent vital sign checks and frequent LOC assessments   Total Provider sedation time (minutes):  20 Post-procedure details:    Post-sedation assessment completed:  08/22/2018 2:55 PM   Attendance: Constant attendance by certified staff until patient recovered     Recovery: Patient returned to pre-procedure baseline     Post-sedation assessments completed and reviewed: airway patency, cardiovascular function, hydration status, mental status and respiratory function     Patient is stable for discharge or admission: yes     Patient tolerance:  Tolerated with difficulty Comments:     Patient did fine with the anesthesia in terms of sedation, we did not need to bag her or do anything else to help her airway, she however turns out was very very anxious going into this.  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  She is neurologically intact at this time she has good cap refill after orthopedic reduction.  Please see Ortho note for further evaluation of their procedure.  In the future, I would think perhaps etomidate  might be a better agent for this patient than ketamine     Jeanmarie Plant, MD 08/22/18 [phone]

## 2018-08-22 NOTE — H&P (Addendum)
The H&P has been reviewed and the patient re-examined.  There are no changes.  The procedure of an open reduction and internal fixation of her multiple right ankle fractures has been discussed, as have the potential risks (including bleeding, infection, nerve and/or blood vessel injury, persistent or recurrent pain, malunion and/or nonunion, stiffness, development of degenerative joint disease, need for further surgery, blood clots, strokes, heart attacks and arrhythmias, etc.) and benefits.  The patient states her understanding and wishes to proceed.  A formal written consent has been signed.

## 2018-08-22 NOTE — Sedation Documentation (Signed)
Xray at bedside to perform reduction films. Patient awake and following commands. Vss. Awaiting  results

## 2018-08-22 NOTE — Sedation Documentation (Signed)
Patient tolerating procedure, vss.

## 2018-08-22 NOTE — ED Provider Notes (Signed)
[facility] Emergency Department Provider Note   ____________________________________________   First MD Initiated Contact with Patient 08/22/18 1305     (approximate)  I have reviewed the triage vital signs and the nursing notes.   HISTORY  Chief Complaint Ankle Pain   HPI Valerie Watson is a 48 y.o. female to the emergency department shortly after falling when she slipped in some mud.  She denies any other injuries and denies hitting her head or any loss of consciousness.  She states that her only injury is to her right ankle.  She has not been ambulatory since her accident.  She last ate at approximately 8 AM.  She has not taken any medication prior to arrival.  Patient rates her pain as a 10/10.   Past Medical History:  Diagnosis Date  . Bell's palsy     There are no active problems to display for this patient.   History reviewed. No pertinent surgical history.  Prior to Admission medications   Medication Sig Start Date End Date Taking? Authorizing Provider  lisinopril-hydrochlorothiazide (ZESTORETIC) 20-12.5 MG tablet Take 1 tablet by mouth daily for 20 days. 08/17/18 09/06/18 Yes Hernandez, Ana P, PA-C  LORazepam (ATIVAN) 1 MG tablet Take 1.5 mg by mouth at bedtime.   Yes [provider]  mirtazapine (REMERON) 15 MG tablet Take 15 mg by mouth at bedtime.   Yes [provider]    Allergies Eggs or egg-derived products and Penicillins  History reviewed. No pertinent family history.  Social History Social History   Tobacco Use  . Smoking status: Never Smoker  . Smokeless tobacco: Never Used  Substance Use Topics  . Alcohol use: Never    Frequency: Never  . Drug use: Not on file    Review of Systems Constitutional: No fever/chills Eyes: No visual changes. ENT: No trauma. Cardiovascular: Denies chest pain. Respiratory: Denies shortness of breath. Gastrointestinal: No abdominal pain.  Mild nausea, no vomiting.     Musculoskeletal: Positive for right ankle pain. Skin: Negative for rash. Neurological: Negative for headaches, focal weakness or numbness. ___________________________________________   PHYSICAL EXAM:  VITAL SIGNS: ED Triage Vitals  Enc Vitals Group     BP --      Pulse Rate 08/22/18 1240 99     Resp --      Temp 08/22/18 1240 97.9 F (36.6 C)     Temp Source 08/22/18 1240 Oral     SpO2 08/22/18 1240 98 %     Weight 08/22/18 1241 140 lb (63.5 kg)     Height 08/22/18 1241 5\' 2"  (1.575 m)     Head Circumference --      Peak Flow --      Pain Score 08/22/18 1245 10     Pain Loc --      Pain Edu? --      Excl. in GC? --    Constitutional: Alert and oriented. Well appearing and in no acute distress. Eyes: Conjunctivae are normal. PERRL. EOMI. Head: Atraumatic. Nose: No trauma. Neck: No stridor.  No cervical tenderness on palpation posteriorly. Cardiovascular: Normal rate, regular rhythm. Grossly normal heart sounds.  Good peripheral circulation. Respiratory: Normal respiratory effort.  No retractions. Lungs CTAB. Gastrointestinal: Soft and nontender. No distention.  Bowel sounds normoactive x4 quadrants.  No CVA tenderness. Musculoskeletal: Patient is able to move upper extremities without any difficulty.  There is no point tenderness on palpation of the thoracic or lumbar spine.  Left lower extremity nontender and  range of motion is without restriction.  On examination of the right ankle there is moderate deformity and soft tissue edema present.  Patient has motor or sensory function in digits distal to her injury.  Capillary refills less than 3 seconds.  Pulse present.  No deformity or tenderness is noted to the right knee. Neurologic:  Normal speech and language. No gross focal neurologic deficits are appreciated. No gait instability. Skin:  Skin is warm, dry and intact.  No abrasions or ecchymosis is present.  No tenting present. Psychiatric: Mood and affect are normal. Speech  and behavior are normal.  ____________________________________________   LABS (all labs ordered are listed, but only abnormal results are displayed)  Labs Reviewed  COMPREHENSIVE METABOLIC PANEL - Abnormal; Notable for the following components:      Result Value   Potassium 3.3 (*)    Glucose, Bld 118 (*)    BUN 22 (*)    All other components within normal limits  CBC WITH DIFFERENTIAL/PLATELET     RADIOLOGY   Official radiology report(s): Dg Ankle Complete Right  Result Date: 08/22/2018 CLINICAL DATA:  Slipping injury. EXAM: RIGHT ANKLE - COMPLETE 3+ VIEW COMPARISON:  No prior. FINDINGS: Angulated comminuted fracture of the medial malleolus. Angulated comminuted fracture of the distal fibula. Displaced fracture of the posterolateral aspect of the distal tibia is also most likely present. Tibial talar dislocation appears to be present. IMPRESSION: Comminuted angulated fractures of the medial malleolus, distal fibula. Displaced fracture of the posterolateral portion of the distal tibia also most likely present. Tibiotalar dislocation appears to be present. Electronically Signed   By: Maisie Fushomas  Register   On: 08/22/2018 13:17    ____________________________________________   PROCEDURES  Procedure(s) performed: None  Procedures  Critical Care performed: No  ____________________________________________   INITIAL IMPRESSION / ASSESSMENT AND PLAN / ED COURSE  As part of my medical decision making, I reviewed the following data within the electronic MEDICAL RECORD NUMBER Notes from prior ED visits and [place] Controlled Substance Database  Patient presents to the ED with injury to her right ankle after she slipped in some mud.  Patient denies any other injuries including loss of consciousness or injury to her head.  She last ate at 8 AM.  She has a deformity of her right ankle and x-ray confirms that she has a tibia talar dislocation, fibula fracture with angulation and a comminuted tibial  fracture.  Dr. Joice LoftsPoggi was called.  Patient was moved to major room # 19 for conscious sedation and Dr. Alphonzo LemmingsMcShane was made aware.     ____________________________________________   FINAL CLINICAL IMPRESSION(S) / ED DIAGNOSES  Final diagnoses:  Status post closed reduction with internal fixation  Closed dislocation of right ankle, initial encounter  Closed fracture of distal end of right tibia, unspecified fracture morphology, initial encounter  Closed fracture of distal end of right fibula, unspecified fracture morphology, initial encounter     ED Discharge Orders    None       Note:  This document was prepared using Dragon voice recognition software and may include unintentional dictation errors.    Tommi RumpsSummers, Rhonda L, PA-C 08/22/18 1514    Willy Eddyobinson, Patrick, MD 08/22/18 [phone]

## 2018-08-22 NOTE — ED Notes (Signed)
Consent obtained for reduction of right ankle fx.

## 2018-08-22 NOTE — Sedation Documentation (Signed)
Patient off unit to OR.

## 2018-08-22 NOTE — Anesthesia Preprocedure Evaluation (Addendum)
Anesthesia Evaluation  Patient identified by MRN, date of birth, ID band Patient awake    Reviewed: Allergy & Precautions, H&P , NPO status , Patient's Chart, lab work & pertinent test results  Airway Mallampati: II  TM Distance: >3 FB     Dental  (+) Chipped   Pulmonary neg pulmonary ROS,           Cardiovascular hypertension,      Neuro/Psych PSYCHIATRIC DISORDERS Anxiety Depression  Neuromuscular disease (Bell's palsy)    GI/Hepatic negative GI ROS, Neg liver ROS,   Endo/Other  negative endocrine ROS  Renal/GU      Musculoskeletal   Abdominal   Peds  Hematology negative hematology ROS (+)   Anesthesia Other Findings Past Medical History: No date: Anxiety No date: Bell's palsy No date: Depression No date: Hypertension  Past Surgical History: No date: APPENDECTOMY No date: CESAREAN SECTION     Comment:  x2  BMI    Body Mass Index:  25.81 kg/m      Reproductive/Obstetrics negative OB ROS                             Anesthesia Physical Anesthesia Plan  ASA: II  Anesthesia Plan: General ETT   Post-op Pain Management:    Induction:   PONV Risk Score and Plan: Ondansetron, Dexamethasone, Midazolam and Treatment may vary due to age or medical condition  Airway Management Planned:   Additional Equipment:   Intra-op Plan:   Post-operative Plan:   Informed Consent: I have reviewed the patients History and Physical, chart, labs and discussed the procedure including the risks, benefits and alternatives for the proposed anesthesia with the patient or authorized representative who has indicated his/her understanding and acceptance.     Dental Advisory Given  Plan Discussed with: Anesthesiologist, CRNA and Surgeon  Anesthesia Plan Comments: (Pt consented to post operative PRN popliteal block.)       Anesthesia Quick Evaluation

## 2018-08-22 NOTE — Op Note (Signed)
08/22/2018  9:13 PM  Patient:   Valerie Watson  Pre-Op Diagnosis:   Closed displaced trimalleolar fracture dislocation, right ankle.  Post-Op Diagnosis:   Same.  Procedure:   Open reduction and internal fixation of closed displaced trimalleolar fracture dislocation, right ankle.  Surgeon:   Maryagnes Amos, MD  Assistant:   None  Anesthesia:   GET  Findings:   As above.  Complications:   None  EBL:   20 cc  Fluids:   800 cc crystalloid  UOP:   None  TT:   85 min at 250 mmHg  Drains:   None  Closure:   Staples  Implants:   Biomet ALPS 7-hole composite locking plate and screws  Brief Clinical Note:   The patient is a 48 year old female who sustained the above-noted injury this afternoon when she apparently slipped while walking across a muddy field. She was brought to the emergency room where x-rays demonstrated a trimalleolar fracture dislocation of her ankle. The fracture was reduced and splinted in the emergency room by Horris Latino, my physician assistant. The patient presents at this time for definitive management of his/her injury.  Procedure:   The patient was brought into the operating room and lain in the supine position. After adequate general endotracheal intubation and anesthesia were obtained, the right foot and lower leg were prepped with ChloraPrep solution, then draped sterilely. Preoperative antibiotics were administered. A timeout was performed to verify the appropriate surgical site before the limb was exsanguinated with an Esmarch and the calf tourniquet inflated to 250 mmHg. Laterally, an 8-10 cm incision was made over the lateral aspect of the distal fibula. The incision was carried down through the subcutaneous tissues to expose the fracture site. The fracture hematoma was debrided before the fracture was reduced and temporarily secured using a bone clamp. A lag screw was placed in an anterior to posterior direction perpendicular to the fracture. A 7-hole  Biomet composite locking plate was contoured using the appropriate plate benders before it was applied over the lateral aspect of the distal fibula. After verifying its position fluoroscopically, it was secured using a 3.5 mm nonlocking cortical screw proximal to the fracture. Again the plate's position was adjusted slightly based on AP and lateral projections before it was secured using additional bicortical screws proximally and multiple locking screws distally. The adequacy of fracture reduction and hardware position was verified fluoroscopically in AP and lateral projections and found to be excellent.  The lag screw was removed and replaced with a shorter screw as it appeared to be too long.  Attention was directed to the medial side. An approximately 3 cm longitudinal incision was made over the anterior and distal portions of the medial malleolus. This incision also was carried down through the subcutaneous tissues to expose the fracture site. Care was taken to identify and protect the saphenous nerve and vein. The fracture hematoma again was removed before the fracture was reduced. The medial malleolar fracture fragment was quite small, so a single guidewire was placed obliquely across the fracture from distal to proximal into the distal tibial metaphysis. After verifying their positions fluoroscopically, the guidewire was over-reamed and replaced with a 40 mm partially threaded 4.0 cancellous screw in lag fashion. Again the adequacy of fracture reduction, hardware position, and mortise restoration was verified in AP, lateral, and oblique projections and found to be excellent.  On the lateral view, the posterior malleolar fracture fragment appeared to be well reduced. However, it appears to incorporate approximately  30% of the articular surface. Therefore, it was felt best to stabilize this fragment. This was accomplished using a single cannulated screw placed in a lag fashion in an anterior to posterior  direction through a stab incision anteriorly. Again the adequacy of fracture reduction and screw position was verified fluoroscopically in AP, lateral, and oblique projections and found to be excellent.  Each wound was copiously irrigated with sterile saline solution. Laterally, the subcutaneous tissues were closed in two layers using 2-0 Vicryl interrupted sutures before the skin was closed using staples. Medially and anteriorly, the subcutaneous tissues were closed using 3-0 Vicryl interrupted sutures before the skin was closed using staples. A total of 30 cc of 0.5% plain Sensorcaine was injected in and around the incision sites to help with postoperative analgesia. Sterile bulky dressings were applied to the wounds before the patient was placed into a posterior splint with a sugar tong supplement, maintaining the ankle in neutral dorsiflexion. The patient was then awakened, extubated, and returned to the recovery room in satisfactory condition after tolerating the procedure well.

## 2018-08-22 NOTE — ED Triage Notes (Signed)
Pt to ER with c/o right ankle pain after slipping in mud.  Right ankle with noted swelling and mild deformitiy.

## 2018-08-22 NOTE — Sedation Documentation (Signed)
Right ankle reduced, splinted. Vss.

## 2018-08-22 NOTE — H&P (Signed)
Subjective:  Chief complaint: Right ankle pain.  The patient is a 48 y.o. female who sustained an injury to the right ankle.  The patient denies any associated injury or loss of consciousness associated with the injury, and denies any light-headedness, loss of consciousness, chest pain, or shortness of breath which might have contributed to the injury.  The patient states that approximately 3 hours prior to arriving to the emergency room she was walking in a muddy field when she suffered a "twisting" injury to the right ankle.  The patient felt a pop and noticed a obvious deformity to the right ankle which prompted her visit to the emergency room.  The patient does have a history of Bell's palsy, denies any personal history of heart attack, stroke or blood clot.  Does report a history of COPD.  She is not diabetic.  No surgical history to the right foot.  She denies any numbness, she does state that she feels a tingling sensation in the right foot upon entering the room.  The patient has not had anything to eat or drink since early this morning.  There are no active problems to display for this patient.  Past Medical History:  Diagnosis Date  . Bell's palsy     History reviewed. No pertinent surgical history.  (Not in a hospital admission)  Allergies  Allergen Reactions  . Eggs Or Egg-Derived Products   . Penicillins     Social History   Tobacco Use  . Smoking status: Never Smoker  . Smokeless tobacco: Never Used  Substance Use Topics  . Alcohol use: Never    Frequency: Never    History reviewed. No pertinent family history.   Review of Systems: As noted above. The patient denies any chest pain, shortness of breath, nausea, vomiting, diarrhea, constipation, belly pain, blood in his/her stool, or burning with urination.  Objective: Temp:  [97.5 F (36.4 C)-97.9 F (36.6 C)] 97.5 F (36.4 C) (01/21 1516) Pulse Rate:  [98-123] 103 (01/21 1516) Resp:  [10-19] 16 (01/21 1516) BP:  (139-195)/(89-144) 145/98 (01/21 1516) SpO2:  [98 %-100 %] 100 % (01/21 1516) Weight:  [63.5 kg-64 kg] 64 kg (01/21 1516)  Physical Exam: General:  Alert, no acute distress Psychiatric:  Patient is competent for consent with normal mood and affect Cardiovascular:  RRR  Respiratory:  Clear to auscultation. No wheezing. Non-labored breathing GI:  Abdomen is soft and non-tender Skin:  No lesions in the area of chief complaint Neurologic:  Sensation intact distally Lymphatic:  No axillary or cervical lymphadenopathy  Orthopedic Exam:  Upon entering the room skin examination right ankle demonstrates a noticeable deformity indicative of a ankle fracture dislocation.  There is noticeable tenting along the medial aspect of the right foot however no obvious break of the skin.  She is intact to light touch over the dorsal and volar aspect of the foot including the deep, superficial peroneal nerves and sural nerves.  Cap refill is intact to each individual toe.  Ankle range of motion was not evaluated.  Pulses were intact to the right foot.  Following the close reduction and splint application cap refill was once again intact to each individual toe.  Imaging Review: Initial x-rays of the right foot were obtained, these x-rays demonstrate a comminuted and angulated fractures of the medial malleolus and distal fibula, displaced fracture of the posterior lateral portion of the distal tibia was also identified with a tibiotalar dislocation.  After close reduction was performed repeat x-rays of  the right ankle were obtained.  These x-rays demonstrate reduction of the ankle dislocation, overall better alignment of the distal fibular fracture.  There does still be evidence of a displaced fracture of the posterior lateral portion of the distal tibia which does not appear either increase in displacement or significantly reduced.  The medial malleolar fracture is improved.  Assessment: Right ankle  fracture-dislocation.  Plan: 1.  Treatment options were discussed today with the patient. 2.  The risk and benefits of a closed reduction of the right ankle fracture dislocation were discussed in detail with the patient including but not limited to failure of the reduction, loss of reduction, nerve and/or blood vessel injury, etc. 3.  Following the reduction the patient was placed into a short leg splint with the ankle as close to neutral dorsiflexion as possible, slight inversion. 4.  The patient was also instructed on the risk and benefits of a open reduction and internal fixation procedure with Dr. Joice Lofts.  These risks include but are not limited to infection, blood clot, stroke, pneumonia, need for future surgery, continued pain, posttraumatic arthritic changes, etc.  The patient and her husband voiced their understanding and would like to proceed with surgery at this time.  A formal consent will be obtained prior to surgery. 5.  Following surgery the patient will follow-up with [facility] in 10-14 days for repeat x-rays of the right ankle. 6.  This document will serve and the surgical history and physical for the patient.  Closed Reduction: Right Ankle After a consent was obtained, the patient was sedated by the emergency room staff.  After appropriate sedation occurred, a ER tech provided countertraction with the knee flexed, the initial dislocation/injury was re-created prior to countertraction applied and a audible clunk could be both felt and palpated indicative of a successful reduction.  I then held the patient's foot in a neutral position by the great toe while the ER tech applied the short leg splint.  The patient woke up without any significant issues however she did suffer a emergence reaction to the ketamine which was provided.  Valeria Batman, PA-C [facility]

## 2018-08-22 NOTE — Progress Notes (Signed)
Pt c/o 10 out of 10 pain in pre-op. Dr. Sampson Goon at bedside. Orders for 0.25mg  dilaudid ; may repeat once.

## 2018-08-22 NOTE — Transfer of Care (Signed)
Immediate Anesthesia Transfer of Care Note  Patient: Valerie Watson  Procedure(s) Performed: OPEN REDUCTION INTERNAL FIXATION (ORIF) ANKLE FRACTURE (Right Ankle)  Patient Location: PACU  Anesthesia Type:General  Level of Consciousness: awake, alert , oriented and patient cooperative  Airway & Oxygen Therapy: Patient Spontanous Breathing and Patient connected to face mask oxygen  Post-op Assessment: Report given to RN and Post -op Vital signs reviewed and stable  Post vital signs: Reviewed and stable  Last Vitals:  Vitals Value Taken Time  BP 139/84 08/22/2018  9:26 PM  Temp 36.6 C 08/22/2018  9:26 PM  Pulse 101 08/22/2018  9:30 PM  Resp 13 08/22/2018  9:30 PM  SpO2 100 % 08/22/2018  9:30 PM  Vitals shown include unvalidated device data.  Last Pain:  Vitals:   08/22/18 1805  TempSrc:   PainSc: 4       Patients Stated Pain Goal: 0 (08/22/18 1422)  Complications: No apparent anesthesia complications

## 2018-08-22 NOTE — ED Notes (Signed)
Report called to [facility] RN    Pt moved to room 19 via stretcher  Family at bedside

## 2018-08-23 ENCOUNTER — Encounter: Payer: Self-pay | Admitting: Surgery

## 2018-08-23 LAB — POTASSIUM: Potassium: 3 mmol/L — ABNORMAL LOW (ref 3.5–5.1)

## 2018-08-23 MED ORDER — ASPIRIN EC 325 MG PO TBEC: 325.0000 mg | DELAYED_RELEASE_TABLET | Freq: Every day | ORAL | 0 refills | Status: AC

## 2018-08-23 MED ORDER — OXYCODONE HCL 5 MG PO TABS: 5.0000 mg | ORAL_TABLET | ORAL | 0 refills | Status: AC | PRN

## 2018-08-23 MED ORDER — TRAMADOL HCL 50 MG PO TABS: 50.0000 mg | ORAL_TABLET | Freq: Four times a day (QID) | ORAL | 0 refills | Status: AC | PRN

## 2018-08-23 MED ORDER — POTASSIUM CHLORIDE 20 MEQ PO PACK
40.0000 meq | PACK | Freq: Once | ORAL | Status: AC
  Administered 2018-08-23: 40 meq via ORAL
  Filled 2018-08-23: qty 2

## 2018-08-23 NOTE — Progress Notes (Signed)
DISCHARGE NOTE:  Pt given discharge instructions and scripts. Pt verbalized understanding. Walker sent with pt.  Pt wheeled to car by staff. Family providing transportation.

## 2018-08-23 NOTE — Care Management (Signed)
RW brought to room by [facility]

## 2018-08-23 NOTE — Care Management Note (Signed)
Case Management Note  Patient Details  Name: Valerie Watson MRN: [id number] Date of Birth: 1971-02-03  Subjective/Objective:                   Met with Patient and SO to discuss Discharge,  Patient is independent at home and has needed transportation Patient can afford meds Is self pay Reached out to [place] with [facility] to inquire about [employer] charity for patient as she has no insurance. [facility] will provide Called Medication management to obtain a [place] Medication assistance resource, unable to reach them, will call back  Action/Plan: Obtained a RW through [place] for patient Expected Discharge Date:  08/23/18               Expected Discharge Plan:     In-House Referral:     Discharge planning Services     Post Acute Care Choice:    Choice offered to:     DME Arranged:  Walker rolling DME Agency:     HH Arranged:    HH Agency:     Status of Service:     If discussed at [employer], dates discussed:    Additional Comments:  Su Hilt, RN 08/23/2018, 10:08 AM

## 2018-08-23 NOTE — Discharge Instructions (Signed)
Diet: As you were doing prior to hospitalization   Shower:  May shower but keep the wounds dry, use an occlusive plastic wrap, NO SOAKING IN TUB.  If the bandage gets wet, change with a clean dry gauze.  Dressing:  Remain in splint until first post-op appointment.  Can loosen the splint if needed.    Activity:  Increase activity slowly as tolerated, but follow the weight bearing instructions below.  No lifting or driving for 6 weeks.  Weight Bearing:   Non-weightbearing to the right leg.  Blood Clot Prevention: Take 1 325mg  aspirin daily until first poast-op appointment.  To prevent constipation: you may use a stool softener such as -  Colace (over the counter) 100 mg by mouth twice a day  Drink plenty of fluids (prune juice may be helpful) and high fiber foods Miralax (over the counter) for constipation as needed.    Itching:  If you experience itching with your medications, try taking only a single pain pill, or even half a pain pill at a time.  You may take up to 10 pain pills per day, and you can also use benadryl over the counter for itching or also to help with sleep.   Precautions:  If you experience chest pain or shortness of breath - call 911 immediately for transfer to the hospital emergency department!!  If you develop a fever greater that 101 F, purulent drainage from wound, increased redness or drainage from wound, or calf pain-Call [facility]                                              Follow- Up Appointment:  Please call for an appointment to be seen in 2 weeks at [facility]

## 2018-08-23 NOTE — Evaluation (Signed)
Physical Therapy Evaluation Patient Details Name: Valerie PeroneKathern Livecchi MRN: [id number] DOB: Jan 30, 1971 Today's Date: 08/23/2018   History of Present Illness  Pt is a 48 year old female admitted s/p r ankle ORIF following a nonsyncopal fall.  PMH includes Htn, Bell's Palsy, anxiety and depression.  Clinical Impression  Pt able to complete transfers and ambulation with CGA and no physical assist.  Very open to education, including WB status and use of RW, and quickly demonstrates undedrstanding.  Pt able to complete stair training with mod A from husband and use of handrail, close CGA from PT.  Pt ambulated 100 ft with RW, requiring 2 rest breaks due to UE fatigue.  Pt was able to avoid R LE contact with floor at all times and demonstrated good strength overall.  She will continue to benefit from skilled PT with focus on strength, tolerance to activity, pain management and safe functional mobility.  Pt will benefit from home health PT to manage HEP and ensure safe household navigation.    Follow Up Recommendations Home health PT;Supervision - Intermittent    Equipment Recommendations  Rolling walker with 5" wheels    Recommendations for Other Services       Precautions / Restrictions Precautions Precautions: Fall Precaution Comments: Fall related admission Restrictions Weight Bearing Restrictions: Yes RLE Weight Bearing: Non weight bearing      Mobility  Bed Mobility Overal bed mobility: Modified Independent             General bed mobility comments: Increased time  Transfers Overall transfer level: Needs assistance Equipment used: Rolling walker (2 wheeled) Transfers: Sit to/from Stand Sit to Stand: Supervision         General transfer comment: education regarding WB status and proper use of RW.  pt able to rise without assistance.  Ambulation/Gait Ambulation/Gait assistance: Min guard Gait Distance (Feet): 100 Feet Assistive device: Rolling walker (2 wheeled)        General Gait Details: Able to manage RW minding NWB status and requiring 2 rest breaks.  Stairs Stairs: Yes Stairs assistance: Mod assist Stair Management: One rail Right Number of Stairs: 3 General stair comments: Husband on L side ascending and R side descending, providing assistance with managment of WB.  Pt able to ascend/descend safely.  Wheelchair Mobility    Modified Rankin (Stroke Patients Only)       Balance Overall balance assessment: Modified Independent                                           Pertinent Vitals/Pain Pain Assessment: 0-10 Pain Score: 7  Pain Location: R ankle Pain Intervention(s): Limited activity within patient's tolerance    Home Living Family/patient expects to be discharged to:: Private residence Living Arrangements: Spouse/significant other Available Help at Discharge: Family(Spouse will be available for 3-4 days and then will need to return to work.) Type of Home: House Home Access: Stairs to enter Entrance Stairs-Rails: Right Entrance Stairs-Number of Steps: 2 Home Layout: One level Home Equipment: None      Prior Function Level of Independence: Independent         Comments: Community ambulator     Higher education careers adviserHand Dominance        Extremity/Trunk Assessment   Upper Extremity Assessment Upper Extremity Assessment: Overall WFL for tasks assessed    Lower Extremity Assessment Lower Extremity Assessment: Overall WFL for tasks assessed;RLE deficits/detail RLE  Deficits / Details: Hip flexion: 4-/5 RLE: Unable to fully assess due to immobilization RLE Sensation: WNL    Cervical / Trunk Assessment Cervical / Trunk Assessment: Normal  Communication   Communication: No difficulties  Cognition Arousal/Alertness: Awake/alert Behavior During Therapy: WFL for tasks assessed/performed Overall Cognitive Status: Within Functional Limits for tasks assessed                                 General  Comments: Pleasant and ready to work with therapy      General Comments      Exercises     Assessment/Plan    PT Assessment Patient needs continued PT services  PT Problem List Decreased strength;Decreased mobility;Decreased activity tolerance;Decreased balance;Decreased knowledge of use of DME;Pain;Decreased range of motion       PT Treatment Interventions DME instruction;Functional mobility training;Balance training;Patient/family education;Gait training;Therapeutic activities;Stair training;Therapeutic exercise    PT Goals (Current goals can be found in the Care Plan section)  Acute Rehab PT Goals Patient Stated Goal: to return to active lifestyle without AD. PT Goal Formulation: With patient Time For Goal Achievement: 09/06/18 Potential to Achieve Goals: Good    Frequency BID   Barriers to discharge        Co-evaluation               AM-PAC PT "6 Clicks" Mobility  Outcome Measure Help needed turning from your back to your side while in a flat bed without using bedrails?: None Help needed moving from lying on your back to sitting on the side of a flat bed without using bedrails?: None Help needed moving to and from a bed to a chair (including a wheelchair)?: A Little Help needed standing up from a chair using your arms (e.g., wheelchair or bedside chair)?: A Little Help needed to walk in hospital room?: A Little Help needed climbing 3-5 steps with a railing? : A Lot 6 Click Score: 19    End of Session Equipment Utilized During Treatment: Gait belt Activity Tolerance: Patient tolerated treatment well Patient left: in bed;with call bell/phone within reach;with bed alarm set;with family/visitor present Nurse Communication: Mobility status PT Visit Diagnosis: Unsteadiness on feet (R26.81);Other abnormalities of gait and mobility (R26.89);Muscle weakness (generalized) (M62.81);Pain Pain - Right/Left: Right Pain - part of body: Ankle and joints of foot    Time:  2423-5361 PT Time Calculation (min) (ACUTE ONLY): 31 min   Charges:   PT Evaluation $PT Eval Low Complexity: 1 Low          Glenetta Hew, PT, DPT   Glenetta Hew 08/23/2018, 10:35 AM

## 2018-08-23 NOTE — Discharge Summary (Signed)
Physician Discharge Summary  Patient ID: Valerie Watson MRN: [id number] DOB/AGE: 241/28/1972 48 y.o.  Admit date: 08/22/2018 Discharge date: 08/23/2018  Admission Diagnoses:  Closed dislocation of right ankle, initial encounter [S93.04XA] Status post closed reduction with internal fixation [Z98.890] Closed fracture of distal end of right tibia, unspecified fracture morphology, initial encounter [S82.301A] Closed fracture of distal end of right fibula, unspecified fracture morphology, initial encounter [S82.831A]  Discharge Diagnoses: Patient Active Problem List   Diagnosis Date Noted  . Ankle fracture, right 08/22/2018    Past Medical History:  Diagnosis Date  . Anxiety   . Bell's palsy   . Depression   . Hypertension    Transfusion: None.   Consultants (if any):   Discharged Condition: Improved  Hospital Course: Valerie Watson is an 48 y.o. female who was admitted 08/22/2018 with a diagnosis of a closed displaced trimalleolar fracture dislocation of the right ankle  and went to the operating room on 08/22/2018 and underwent the above named procedures.   The patient underwent a closed reduction of right ankle dislocation in the ER which is detailed in the patient's history and physical.   Surgeries: Procedure(s): OPEN REDUCTION INTERNAL FIXATION (ORIF) ANKLE FRACTURE on 08/22/2018 Patient tolerated the surgery well. Taken to PACU where she was stabilized and then transferred to the orthopedic floor.  Started on Lovenox 40mg  q 24 hrs. Foot pumps applied bilaterally at 80 mm. Heels elevated on bed with rolled towels. No evidence of DVT. Negative Homan. Physical therapy started on day #1 for gait training and transfer. OT started day #1 for ADL and assisted devices.  Patient's IV was removed on POD1.  Implants: Biomet ALPS 7-hole composite locking plate and screws.  She was given perioperative antibiotics:  Anti-infectives (From admission, onward)   Start     Dose/Rate  Route Frequency Ordered Stop   08/22/18 2230  clindamycin (CLEOCIN) IVPB 600 mg     600 mg 100 mL/hr over 30 Minutes Intravenous Every 6 hours 08/22/18 2229 08/23/18 1629   08/22/18 1836  bacitracin 50,000 Units in sodium chloride 0.9 % 500 mL irrigation  Status:  Discontinued       As needed 08/22/18 1837 08/22/18 2122   08/22/18 1830  clindamycin (CLEOCIN) IVPB 600 mg     600 mg 100 mL/hr over 30 Minutes Intravenous  Once 08/22/18 1656 08/22/18 1913   08/22/18 1649  clindamycin (CLEOCIN) 600 MG/50ML IVPB    Note to Pharmacy:  Mardene CelesteYow, Sharon   : cabinet override      08/22/18 1649 08/22/18 1913    .  She was given sequential compression devices, early ambulation, and Lovenox for DVT prophylaxis.  She benefited maximally from the hospital stay and there were no complications.    Recent vital signs:  Vitals:   08/23/18 0321 08/23/18 0726  BP: 108/69 122/83  Pulse: 86 77  Resp: 18 18  Temp: 97.8 F (36.6 C) (!) 97.5 F (36.4 C)  SpO2: 94% 99%    Recent laboratory studies:  Lab Results  Component Value Date   HGB 13.3 08/22/2018   Lab Results  Component Value Date   WBC 7.0 08/22/2018   PLT 304 08/22/2018   No results found for: INR Lab Results  Component Value Date   NA 141 08/22/2018   K 3.3 (L) 08/22/2018   CL 107 08/22/2018   CO2 24 08/22/2018   BUN 22 (H) 08/22/2018   CREATININE 0.78 08/22/2018   GLUCOSE 118 (H) 08/22/2018    Discharge  Medications:   Allergies as of 08/23/2018      Reactions   Eggs Or Egg-derived Products    Penicillins       Medication List    TAKE these medications   aspirin EC 325 MG tablet Take 1 tablet (325 mg total) by mouth daily.   lisinopril-hydrochlorothiazide 20-12.5 MG tablet Commonly known as:  ZESTORETIC Take 1 tablet by mouth daily for 20 days.   LORazepam 1 MG tablet Commonly known as:  ATIVAN Take 1.5 mg by mouth at bedtime.   mirtazapine 15 MG tablet Commonly known as:  REMERON Take 15 mg by mouth at  bedtime.   oxyCODONE 5 MG immediate release tablet Commonly known as:  Oxy IR/ROXICODONE Take 1-2 tablets (5-10 mg total) by mouth every 4 (four) hours as needed for moderate pain (pain score 4-6).   traMADol 50 MG tablet Commonly known as:  ULTRAM Take 1 tablet (50 mg total) by mouth every 6 (six) hours as needed for moderate pain.       Diagnostic Studies: Dg Ankle 2 Views Right  Result Date: 08/22/2018 CLINICAL DATA:  Postoperative shin internal fixation of the right ankle. EXAM: RIGHT ANKLE - 2 VIEW COMPARISON:  08/22/2018 FINDINGS: Intraoperative fluoroscopic images from open reduction internal fixation of the right ankle demonstrate sideplate and screw fixation of the distal fibula and single cancellous screw fixation of the medial malleolus. The alignment is near anatomic. Fluoroscopy time is recorded as 42 seconds. IMPRESSION: Intraoperative images from right ankle open repair. Electronically Signed   By: Ted Mcalpineobrinka  Dimitrova M.D.   On: 08/22/2018 20:48   Dg Ankle Complete Right  Result Date: 08/22/2018 CLINICAL DATA:  Closed reduction of ankle fracture EXAM: RIGHT ANKLE - COMPLETE 3+ VIEW COMPARISON:  None. FINDINGS: Fine bony detail is limited due to overlying new plastic splint. Improved alignment status post closed reduction tibiotalar dislocation. Fractures of the medial, lateral and posterior malleoli are redemonstrated with improved alignment also demonstrated. Calcaneal enthesopathy is identified along the plantar dorsal aspect. The midfoot and subtalar joints appear congruent. IMPRESSION: Improved alignment status post closed reduction of trimalleolar fracture-dislocation. The ankle joint appears reduced into position on images provided. Electronically Signed   By: Tollie Ethavid  Kwon M.D.   On: 08/22/2018 15:16   Dg Ankle Complete Right  Result Date: 08/22/2018 CLINICAL DATA:  Slipping injury. EXAM: RIGHT ANKLE - COMPLETE 3+ VIEW COMPARISON:  No prior. FINDINGS: Angulated comminuted  fracture of the medial malleolus. Angulated comminuted fracture of the distal fibula. Displaced fracture of the posterolateral aspect of the distal tibia is also most likely present. Tibial talar dislocation appears to be present. IMPRESSION: Comminuted angulated fractures of the medial malleolus, distal fibula. Displaced fracture of the posterolateral portion of the distal tibia also most likely present. Tibiotalar dislocation appears to be present. Electronically Signed   By: Maisie Fushomas  Register   On: 08/22/2018 13:17   Dg C-arm 1-60 Min-no Report  Result Date: 08/22/2018 Fluoroscopy was utilized by the requesting physician.  No radiographic interpretation.   Disposition: Will plan for discharge home today following session with PT in the morning.  Follow-up in 10-14 days, remain in splint until first post-op appointment.  Follow-up Information    Anson [place],  Lance, PA-C. Call in 1 day(s).   Specialty:  Physician Assistant Why:  Splint and staple removal in 14 days. Contact information: [address] [facility] [place] [id number] [phone]          Signed: Meriel Pica L  PA-C 08/23/2018,  8:10 AM

## 2018-08-23 NOTE — Progress Notes (Signed)
  Subjective: 1 Day Post-Op Procedure(s) (LRB): OPEN REDUCTION INTERNAL FIXATION (ORIF) ANKLE FRACTURE (Right) Patient reports pain as 7 on 0-10 scale.   Patient is well, and has had no acute complaints or problems Plan is to go Home after hospital stay. Negative for chest pain and shortness of breath Fever: no Gastrointestinal:Negative for nausea and vomiting  Objective: Vital signs in last 24 hours: Temp:  [97.5 F (36.4 C)-98.9 F (37.2 C)] 97.5 F (36.4 C) (01/22 0726) Pulse Rate:  [77-123] 77 (01/22 0726) Resp:  [10-20] 18 (01/22 0726) BP: (104-195)/(65-144) 122/83 (01/22 0726) SpO2:  [92 %-100 %] 99 % (01/22 0726) Weight:  [63.5 kg-64 kg] 64 kg (01/21 1516)  Intake/Output from previous day:  Intake/Output Summary (Last 24 hours) at 08/23/2018 0801 Last data filed at 08/23/2018 0600 Gross per 24 hour  Intake 1468.08 ml  Output 20 ml  Net 1448.08 ml    Intake/Output this shift: No intake/output data recorded.  Labs: Recent Labs    08/22/18 1326  HGB 13.3   Recent Labs    08/22/18 1326  WBC 7.0  RBC 4.17  HCT 40.1  PLT 304   Recent Labs    08/22/18 1326  NA 141  K 3.3*  CL 107  CO2 24  BUN 22*  CREATININE 0.78  GLUCOSE 118*  CALCIUM 9.1   No results for input(s): LABPT, INR in the last 72 hours.   EXAM General - Patient is Alert, Appropriate and Oriented Extremity - ABD soft  Short leg splint intact.  Intact to light touch to the dorsal and volar aspect of the toes, able to flex and extend without pain.  Cap refill intact to each toe. Dressing/Incision - clean, dry, no drainage Motor Function - intact, moving toes well on exam.  Past Medical History:  Diagnosis Date  . Anxiety   . Bell's palsy   . Depression   . Hypertension     Assessment/Plan: 1 Day Post-Op Procedure(s) (LRB): OPEN REDUCTION INTERNAL FIXATION (ORIF) ANKLE FRACTURE (Right) Active Problems:   Ankle fracture, right  Estimated body mass index is 25.81 kg/m as  calculated from the following:   Height as of this encounter: 5\' 2"  (1.575 m).   Weight as of this encounter: 64 kg. Advance diet Up with therapy D/C IV fluids when tolerating po intake.  Labs reviewed this AM, labs present from yesterday. K+ 3.3 yesterday, will re-check this AM. Up with therapy today. Plan for discharge home this afternoon.  DVT Prophylaxis - Lovenox Non-Weight-Bearing as tolerated to right leg  J. Horris Latino, PA-C [facility] Orthopa[facility] 08/23/2018, 8:01 AM

## 2018-08-24 NOTE — Anesthesia Postprocedure Evaluation (Signed)
Anesthesia Post Note  Patient: Valerie PeroneKathern Watson  Procedure(s) Performed: OPEN REDUCTION INTERNAL FIXATION (ORIF) ANKLE FRACTURE (Right Ankle)  Patient location during evaluation: PACU Anesthesia Type: General Level of consciousness: awake and alert Pain management: pain level controlled Vital Signs Assessment: post-procedure vital signs reviewed and stable Respiratory status: spontaneous breathing, nonlabored ventilation and respiratory function stable Cardiovascular status: blood pressure returned to baseline and stable Postop Assessment: no apparent nausea or vomiting Anesthetic complications: no     Last Vitals:  Vitals:   08/23/18 0726 08/23/18 1146  BP: 122/83 128/86  Pulse: 77 92  Resp: 18   Temp: (!) 36.4 C 36.8 C  SpO2: 99% 100%    Last Pain:  Vitals:   08/23/18 1146  TempSrc: Oral  PainSc:                  Valerie Watson

## 2020-08-08 IMAGING — DX DG ANKLE COMPLETE 3+V*R*
4 series · 4 of 4 positions shown · non-contrast
Comparison: No prior.

CLINICAL DATA: Slipping injury.

EXAM:
RIGHT ANKLE - COMPLETE 3+ VIEW

[ankle ap]
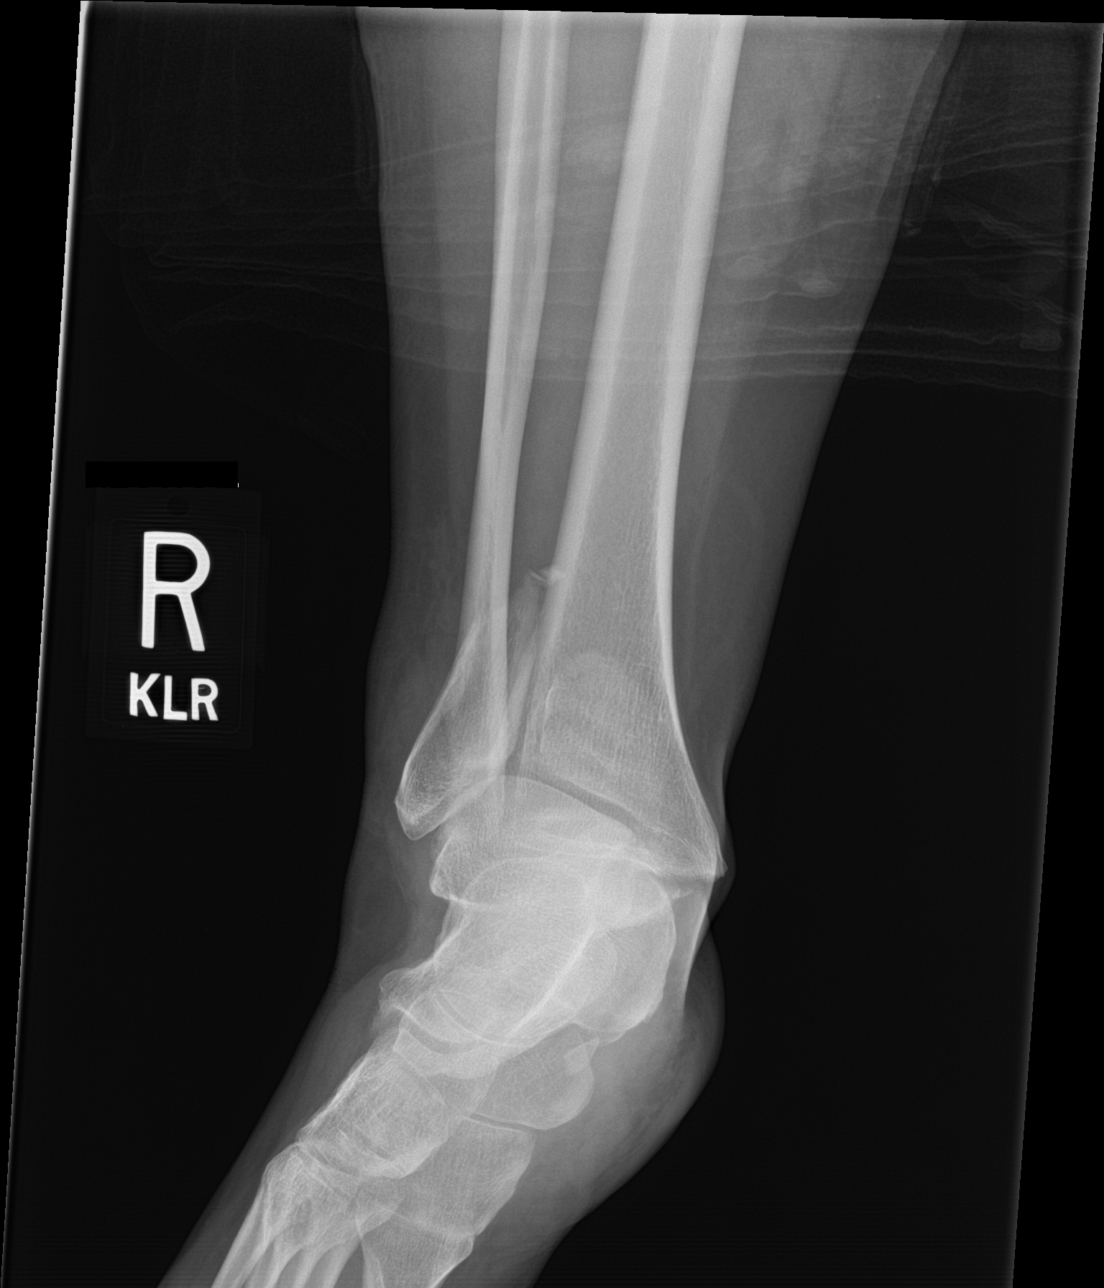

[ankle obl (1 of 2)]
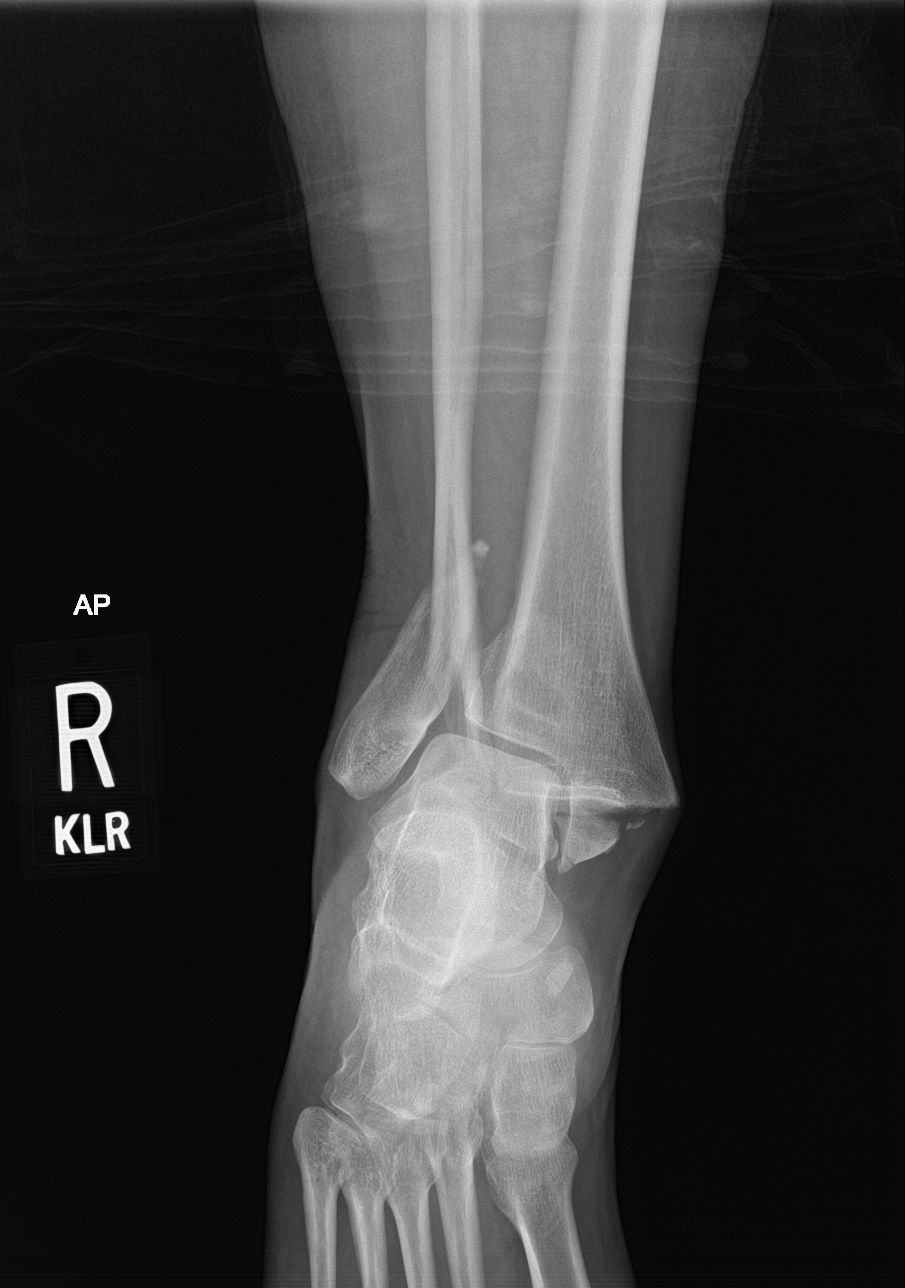

[ankle lat]
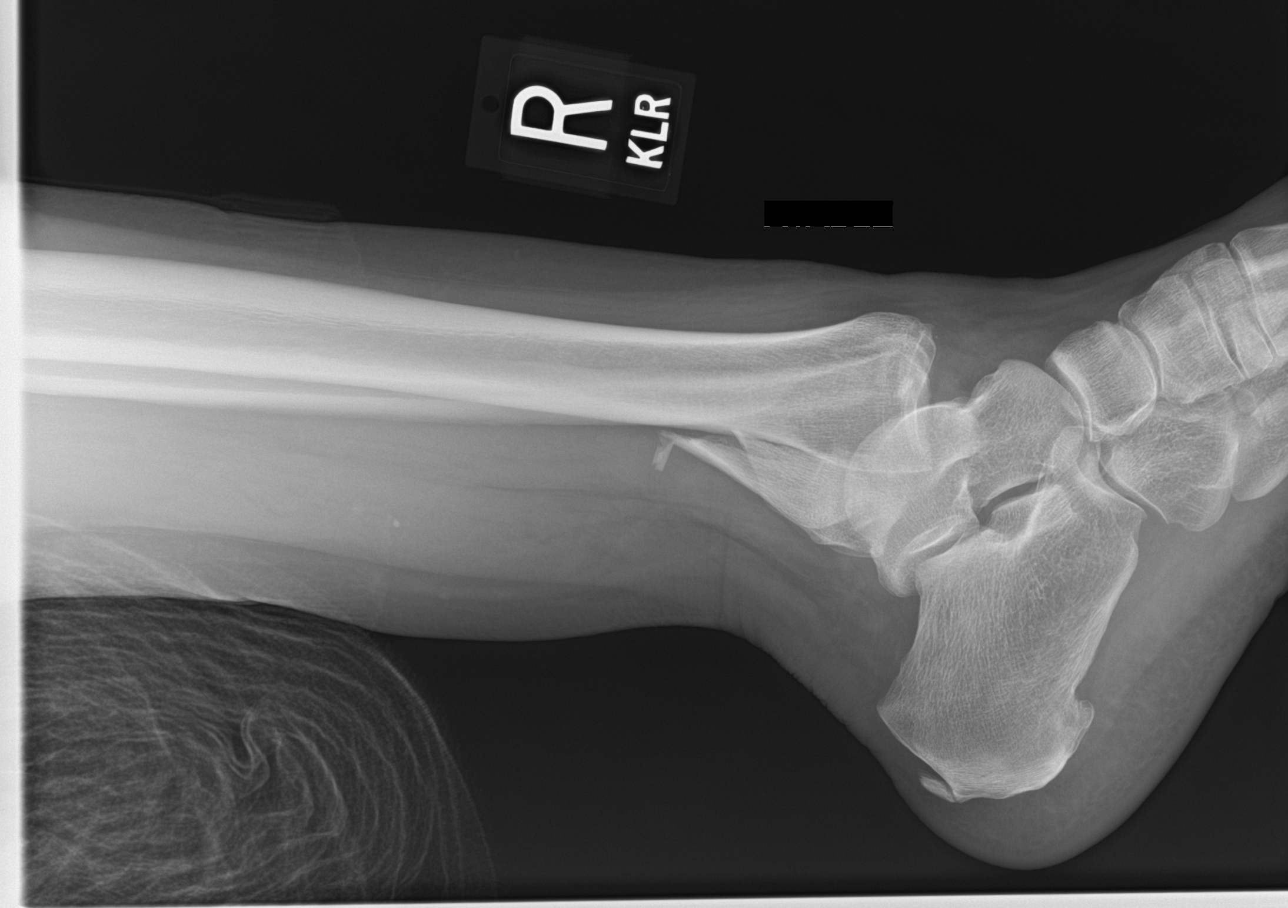

[ankle obl (2 of 2)]
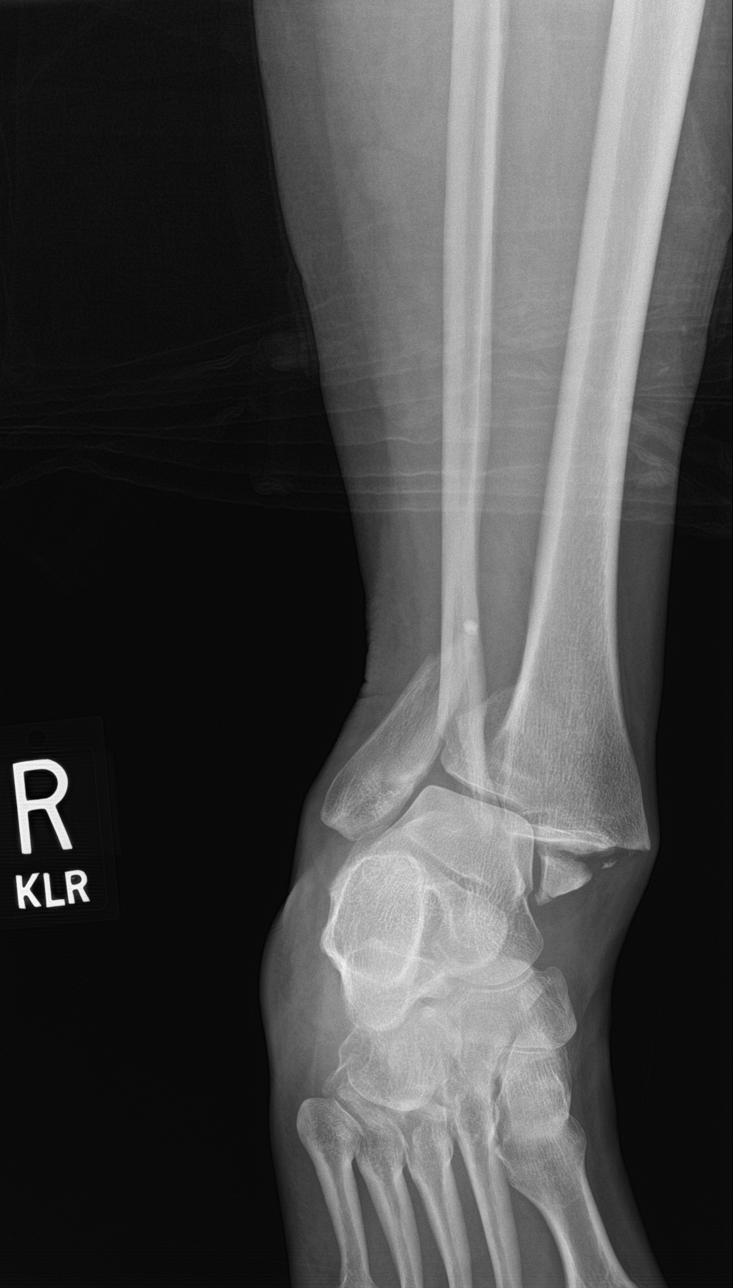

[4 of 4 positions shown; findings below may reference images not displayed]

FINDINGS: Angulated comminuted fracture of the medial malleolus. Angulated
comminuted fracture of the distal fibula. Displaced fracture of the
posterolateral aspect of the distal tibia is also most likely
present. Tibial talar dislocation appears to be present.
IMPRESSION: Comminuted angulated fractures of the medial malleolus, distal
fibula. Displaced fracture of the posterolateral portion of the
distal tibia also most likely present. Tibiotalar dislocation
appears to be present.
